# Patient Record
Sex: Female | Born: 1985
Health system: Southern US, Community
[De-identification: ages and names within clinical notes are randomized; demographics above are authoritative.]

## PROBLEM LIST (undated history)

## (undated) DIAGNOSIS — R011 Cardiac murmur, unspecified: Secondary | ICD-10-CM

## (undated) DIAGNOSIS — N390 Urinary tract infection, site not specified: Secondary | ICD-10-CM

## (undated) DIAGNOSIS — F32A Depression, unspecified: Secondary | ICD-10-CM

## (undated) DIAGNOSIS — K2970 Gastritis, unspecified, without bleeding: Secondary | ICD-10-CM

## (undated) DIAGNOSIS — F329 Major depressive disorder, single episode, unspecified: Secondary | ICD-10-CM

## (undated) DIAGNOSIS — F419 Anxiety disorder, unspecified: Secondary | ICD-10-CM

## (undated) DIAGNOSIS — K219 Gastro-esophageal reflux disease without esophagitis: Secondary | ICD-10-CM

## (undated) HISTORY — PX: CHOLECYSTECTOMY: SHX55

## (undated) HISTORY — PX: IUD REMOVAL: SHX5392

## (undated) HISTORY — DX: Cardiac murmur, unspecified: R01.1

---
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
## 2006-05-08 ENCOUNTER — Inpatient Hospital Stay: Payer: Self-pay

## 2007-06-05 ENCOUNTER — Ambulatory Visit: Payer: Self-pay | Admitting: Family Medicine

## 2007-06-28 ENCOUNTER — Ambulatory Visit: Payer: Self-pay | Admitting: General Surgery

## 2008-03-14 ENCOUNTER — Emergency Department (HOSPITAL_COMMUNITY): Admission: EM | Admit: 2008-03-14 | Discharge: 2008-03-14 | Payer: Self-pay | Admitting: Emergency Medicine

## 2008-08-07 HISTORY — PX: CHOLECYSTECTOMY: SHX55

## 2010-02-16 ENCOUNTER — Ambulatory Visit (HOSPITAL_COMMUNITY): Admission: RE | Admit: 2010-02-16 | Discharge: 2010-02-16 | Payer: Self-pay | Admitting: Obstetrics & Gynecology

## 2010-05-23 ENCOUNTER — Inpatient Hospital Stay (HOSPITAL_COMMUNITY): Admission: AD | Admit: 2010-05-23 | Discharge: 2010-05-23 | Payer: Self-pay | Admitting: Obstetrics & Gynecology

## 2010-05-23 ENCOUNTER — Ambulatory Visit: Payer: Self-pay | Admitting: Obstetrics and Gynecology

## 2010-07-29 ENCOUNTER — Ambulatory Visit (HOSPITAL_COMMUNITY)
Admission: RE | Admit: 2010-07-29 | Discharge: 2010-07-29 | Payer: Self-pay | Source: Home / Self Care | Attending: Family Medicine | Admitting: Family Medicine

## 2010-07-30 ENCOUNTER — Inpatient Hospital Stay (HOSPITAL_COMMUNITY)
Admission: AD | Admit: 2010-07-30 | Discharge: 2010-07-30 | Payer: Self-pay | Source: Home / Self Care | Attending: Obstetrics & Gynecology | Admitting: Obstetrics & Gynecology

## 2010-08-03 ENCOUNTER — Inpatient Hospital Stay (HOSPITAL_COMMUNITY)
Admission: AD | Admit: 2010-08-03 | Discharge: 2010-08-03 | Payer: Self-pay | Source: Home / Self Care | Attending: Obstetrics & Gynecology | Admitting: Obstetrics & Gynecology

## 2010-08-04 ENCOUNTER — Inpatient Hospital Stay (HOSPITAL_COMMUNITY)
Admission: AD | Admit: 2010-08-04 | Discharge: 2010-08-06 | Payer: Self-pay | Source: Home / Self Care | Attending: Family Medicine | Admitting: Family Medicine

## 2010-08-05 ENCOUNTER — Ambulatory Visit (HOSPITAL_COMMUNITY): Admission: RE | Admit: 2010-08-05 | Payer: Self-pay | Source: Home / Self Care | Admitting: Family Medicine

## 2010-10-17 LAB — CBC
HCT: 34.8 % — ABNORMAL LOW (ref 36.0–46.0)
HCT: 37.4 % (ref 36.0–46.0)
Hemoglobin: 11.6 g/dL — ABNORMAL LOW (ref 12.0–15.0)
MCH: 27.4 pg (ref 26.0–34.0)
MCV: 79.4 fL (ref 78.0–100.0)
MCV: 80.9 fL (ref 78.0–100.0)
Platelets: 140 10*3/uL — ABNORMAL LOW (ref 150–400)
RBC: 4.3 MIL/uL (ref 3.87–5.11)
RBC: 4.71 MIL/uL (ref 3.87–5.11)
WBC: 10.1 10*3/uL (ref 4.0–10.5)

## 2010-10-19 LAB — URINALYSIS, ROUTINE W REFLEX MICROSCOPIC
Nitrite: NEGATIVE
Protein, ur: NEGATIVE mg/dL

## 2011-01-23 ENCOUNTER — Inpatient Hospital Stay (INDEPENDENT_AMBULATORY_CARE_PROVIDER_SITE_OTHER)
Admission: RE | Admit: 2011-01-23 | Discharge: 2011-01-23 | Disposition: A | Discharge status: Home / Self Care | Payer: Self-pay | Source: Ambulatory Visit | Attending: Family Medicine | Admitting: Family Medicine

## 2011-01-23 DIAGNOSIS — G44209 Tension-type headache, unspecified, not intractable: Secondary | ICD-10-CM

## 2011-01-23 DIAGNOSIS — R198 Other specified symptoms and signs involving the digestive system and abdomen: Secondary | ICD-10-CM

## 2011-01-23 DIAGNOSIS — R1013 Epigastric pain: Secondary | ICD-10-CM

## 2011-05-28 ENCOUNTER — Emergency Department (HOSPITAL_COMMUNITY)
Admission: EM | Admit: 2011-05-28 | Discharge: 2011-05-28 | Disposition: A | Discharge status: Home / Self Care | Payer: Self-pay | Attending: Emergency Medicine | Admitting: Emergency Medicine

## 2011-05-28 ENCOUNTER — Emergency Department (HOSPITAL_COMMUNITY): Payer: Self-pay

## 2011-05-28 DIAGNOSIS — W19XXXA Unspecified fall, initial encounter: Secondary | ICD-10-CM | POA: Insufficient documentation

## 2011-05-28 DIAGNOSIS — M25439 Effusion, unspecified wrist: Secondary | ICD-10-CM | POA: Insufficient documentation

## 2011-05-28 DIAGNOSIS — S60219A Contusion of unspecified wrist, initial encounter: Secondary | ICD-10-CM | POA: Insufficient documentation

## 2012-11-15 ENCOUNTER — Emergency Department (HOSPITAL_COMMUNITY)
Admission: EM | Admit: 2012-11-15 | Discharge: 2012-11-15 | Disposition: A | Discharge status: Home / Self Care | Payer: Self-pay | Attending: Emergency Medicine | Admitting: Emergency Medicine

## 2012-11-15 ENCOUNTER — Encounter (HOSPITAL_COMMUNITY): Payer: Self-pay

## 2012-11-15 DIAGNOSIS — R300 Dysuria: Secondary | ICD-10-CM | POA: Insufficient documentation

## 2012-11-15 DIAGNOSIS — R5381 Other malaise: Secondary | ICD-10-CM | POA: Insufficient documentation

## 2012-11-15 DIAGNOSIS — Z79899 Other long term (current) drug therapy: Secondary | ICD-10-CM | POA: Insufficient documentation

## 2012-11-15 DIAGNOSIS — Z3202 Encounter for pregnancy test, result negative: Secondary | ICD-10-CM | POA: Insufficient documentation

## 2012-11-15 DIAGNOSIS — K219 Gastro-esophageal reflux disease without esophagitis: Secondary | ICD-10-CM | POA: Insufficient documentation

## 2012-11-15 DIAGNOSIS — R509 Fever, unspecified: Secondary | ICD-10-CM | POA: Insufficient documentation

## 2012-11-15 DIAGNOSIS — R51 Headache: Secondary | ICD-10-CM | POA: Insufficient documentation

## 2012-11-15 DIAGNOSIS — J020 Streptococcal pharyngitis: Secondary | ICD-10-CM | POA: Insufficient documentation

## 2012-11-15 DIAGNOSIS — IMO0001 Reserved for inherently not codable concepts without codable children: Secondary | ICD-10-CM | POA: Insufficient documentation

## 2012-11-15 DIAGNOSIS — Z87440 Personal history of urinary (tract) infections: Secondary | ICD-10-CM | POA: Insufficient documentation

## 2012-11-15 HISTORY — DX: Urinary tract infection, site not specified: N39.0

## 2012-11-15 HISTORY — DX: Gastro-esophageal reflux disease without esophagitis: K21.9

## 2012-11-15 LAB — URINALYSIS, ROUTINE W REFLEX MICROSCOPIC
Bilirubin Urine: NEGATIVE
Glucose, UA: NEGATIVE mg/dL
Hgb urine dipstick: NEGATIVE
Nitrite: NEGATIVE
Specific Gravity, Urine: 1.009 (ref 1.005–1.030)
pH: 6 (ref 5.0–8.0)

## 2012-11-15 LAB — POCT I-STAT, CHEM 8
Calcium, Ion: 1.18 mmol/L (ref 1.12–1.23)
Chloride: 103 mEq/L (ref 96–112)
Creatinine, Ser: 0.8 mg/dL (ref 0.50–1.10)
Glucose, Bld: 115 mg/dL — ABNORMAL HIGH (ref 70–99)
Potassium: 3.6 mEq/L (ref 3.5–5.1)
TCO2: 22 mmol/L (ref 0–100)

## 2012-11-15 LAB — RAPID STREP SCREEN (MED CTR MEBANE ONLY): Streptococcus, Group A Screen (Direct): POSITIVE — AB

## 2012-11-15 LAB — CBC
Hemoglobin: 12.4 g/dL (ref 12.0–15.0)
Platelets: 170 10*3/uL (ref 150–400)
RBC: 4.36 MIL/uL (ref 3.87–5.11)
WBC: 16.4 10*3/uL — ABNORMAL HIGH (ref 4.0–10.5)

## 2012-11-15 LAB — URINE MICROSCOPIC-ADD ON

## 2012-11-15 MED ORDER — ACETAMINOPHEN 325 MG PO TABS
650.0000 mg | ORAL_TABLET | Freq: Once | ORAL | Status: AC
  Administered 2012-11-15: 650 mg via ORAL
  Filled 2012-11-15: qty 2

## 2012-11-15 MED ORDER — PENICILLIN G BENZATHINE 1200000 UNIT/2ML IM SUSP
1.2000 10*6.[IU] | Freq: Once | INTRAMUSCULAR | Status: AC
  Administered 2012-11-15: 1.2 10*6.[IU] via INTRAMUSCULAR
  Filled 2012-11-15: qty 2

## 2012-11-15 MED ORDER — KETOROLAC TROMETHAMINE 30 MG/ML IJ SOLN
30.0000 mg | Freq: Once | INTRAMUSCULAR | Status: AC
  Administered 2012-11-15: 30 mg via INTRAVENOUS
  Filled 2012-11-15: qty 1

## 2012-11-15 MED ORDER — SODIUM CHLORIDE 0.9 % IV BOLUS (SEPSIS)
1000.0000 mL | Freq: Once | INTRAVENOUS | Status: AC
  Administered 2012-11-15: 1000 mL via INTRAVENOUS

## 2012-11-15 NOTE — ED Notes (Addendum)
Pt also c/o dizziness and upper back pain. Pt reports she was dx w/a UTI in March and did not complete her ABX of Cipro

## 2012-11-15 NOTE — ED Provider Notes (Signed)
History     CSN: [id number]  Arrival date & time 11/15/12  1121   First MD Initiated Contact with Patient 11/15/12 1216      Chief Complaint  Patient presents with  . Fever  . Headache    (Consider location/radiation/quality/duration/timing/severity/associated sxs/prior treatment) Patient is a 27 y.o. female presenting with fever and headaches.  Fever Associated symptoms: headaches   Headache Associated symptoms: fever    Pt with history of chronic headaches reports she has had a headache off and on for several weeks. Also began to run a fever last night with chills, myalgias, general malaise. This morning began to have sore throat as well. Denies any CP, SOB, no vomiting or diarrhea. She has had some dysuria. States she was diagnosed with UTI a couple of weeks ago at the [facility] and given Rx for Cipro but did not finish course.   Past Medical History  Diagnosis Date  . GERD (gastroesophageal reflux disease)   . UTI (lower urinary tract infection)     Past Surgical History  Procedure Laterality Date  . Cholecystectomy      History reviewed. No pertinent family history.  History  Substance Use Topics  . Smoking status: Never Smoker   . Smokeless tobacco: Not on file  . Alcohol Use: Yes     Comment: social    OB History   Grav Para Term Preterm Abortions TAB SAB Ect Mult Living                  Review of Systems  Constitutional: Positive for fever.  Neurological: Positive for headaches.   All other systems reviewed and are negative except as noted in HPI.   Allergies  Review of patient's allergies indicates no known allergies.  Home Medications   Current Outpatient Rx  Name  Route  Sig  Dispense  Refill  . butalbital-acetaminophen-caffeine (FIORICET, ESGIC) 50-325-40 MG per tablet   Oral   Take 1 tablet by mouth every 4 (four) hours as needed for headache.         . ciprofloxacin (CIPRO) 500 MG tablet   Oral   Take 500 mg by mouth 2 (two) times  daily. For 7 days.         . ranitidine (ZANTAC) 150 MG tablet   Oral   Take 150 mg by mouth daily.           BP 119/54  Pulse 111  Temp(Src) 103 F (39.4 C) (Oral)  Resp 18  SpO2 97%  Physical Exam  Nursing note and vitals reviewed. Constitutional: She is oriented to person, place, and time. She appears well-developed and well-nourished.  HENT:  Head: Normocephalic and atraumatic.  Eyes: EOM are normal. Pupils are equal, round, and reactive to light.  Neck: Normal range of motion. Neck supple.  Cardiovascular: Normal rate, normal heart sounds and intact distal pulses.   Pulmonary/Chest: Effort normal and breath sounds normal.  Abdominal: Bowel sounds are normal. She exhibits no distension. There is no tenderness. There is no rebound and no guarding.  Musculoskeletal: Normal range of motion. She exhibits no edema and no tenderness.  Neurological: She is alert and oriented to person, place, and time. She has normal strength. No cranial nerve deficit or sensory deficit.  Skin: Skin is warm and dry. No rash noted.  Psychiatric: She has a normal mood and affect.    ED Course  Procedures (including critical care time)  Labs Reviewed  RAPID STREP SCREEN - Abnormal;  Notable for the following:    Streptococcus, Group A Screen (Direct) POSITIVE (*)    All other components within normal limits  CBC - Abnormal; Notable for the following:    WBC 16.4 (*)    HCT 35.3 (*)    All other components within normal limits  URINALYSIS, ROUTINE W REFLEX MICROSCOPIC - Abnormal; Notable for the following:    APPearance CLOUDY (*)    Leukocytes, UA MODERATE (*)    All other components within normal limits  URINE MICROSCOPIC-ADD ON - Abnormal; Notable for the following:    Squamous Epithelial / LPF MANY (*)    Bacteria, UA FEW (*)    All other components within normal limits  POCT I-STAT, CHEM 8 - Abnormal; Notable for the following:    Glucose, Bld 115 (*)    All other components  within normal limits  POCT PREGNANCY, URINE   No results found.   1. Strep pharyngitis   2. Headache       MDM  Strep swab is positive, otherwise labs are normal. PCN IM, PCP referral.         Bonnita Levan. Bernette Mayers, MD 11/15/12 [phone]

## 2012-11-15 NOTE — ED Notes (Signed)
Pt c/o bilateral ear pain, bilateral eye pain, sore throat, fever, nausea and body aches starting last night and headache x1 week. Pt seen at [facility] and prescribed Fioricet 50/325 mg and Ibuprofen.

## 2013-04-17 ENCOUNTER — Encounter (HOSPITAL_COMMUNITY): Payer: Self-pay | Admitting: Emergency Medicine

## 2013-04-17 ENCOUNTER — Emergency Department (HOSPITAL_COMMUNITY)
Admission: EM | Admit: 2013-04-17 | Discharge: 2013-04-17 | Disposition: A | Discharge status: Home / Self Care | Payer: Self-pay | Attending: Emergency Medicine | Admitting: Emergency Medicine

## 2013-04-17 DIAGNOSIS — N949 Unspecified condition associated with female genital organs and menstrual cycle: Secondary | ICD-10-CM | POA: Insufficient documentation

## 2013-04-17 DIAGNOSIS — Z87440 Personal history of urinary (tract) infections: Secondary | ICD-10-CM | POA: Insufficient documentation

## 2013-04-17 DIAGNOSIS — Z3202 Encounter for pregnancy test, result negative: Secondary | ICD-10-CM | POA: Insufficient documentation

## 2013-04-17 DIAGNOSIS — Z79899 Other long term (current) drug therapy: Secondary | ICD-10-CM | POA: Insufficient documentation

## 2013-04-17 DIAGNOSIS — R102 Pelvic and perineal pain: Secondary | ICD-10-CM

## 2013-04-17 DIAGNOSIS — N939 Abnormal uterine and vaginal bleeding, unspecified: Secondary | ICD-10-CM

## 2013-04-17 DIAGNOSIS — N898 Other specified noninflammatory disorders of vagina: Secondary | ICD-10-CM | POA: Insufficient documentation

## 2013-04-17 DIAGNOSIS — R109 Unspecified abdominal pain: Secondary | ICD-10-CM | POA: Insufficient documentation

## 2013-04-17 DIAGNOSIS — Z9089 Acquired absence of other organs: Secondary | ICD-10-CM | POA: Insufficient documentation

## 2013-04-17 DIAGNOSIS — Z8719 Personal history of other diseases of the digestive system: Secondary | ICD-10-CM | POA: Insufficient documentation

## 2013-04-17 LAB — URINE MICROSCOPIC-ADD ON

## 2013-04-17 LAB — COMPREHENSIVE METABOLIC PANEL
ALT: 25 U/L (ref 0–35)
Albumin: 4.4 g/dL (ref 3.5–5.2)
Alkaline Phosphatase: 51 U/L (ref 39–117)
Chloride: 102 mEq/L (ref 96–112)
GFR calc Af Amer: >90 mL/min (ref >90)
Glucose, Bld: 121 mg/dL — ABNORMAL HIGH (ref 70–99)
Potassium: 3.4 mEq/L — ABNORMAL LOW (ref 3.5–5.1)
Sodium: 137 mEq/L (ref 135–145)
Total Protein: 7.9 g/dL (ref 6.0–8.3)

## 2013-04-17 LAB — URINALYSIS, ROUTINE W REFLEX MICROSCOPIC
Bilirubin Urine: NEGATIVE
Glucose, UA: NEGATIVE mg/dL
Ketones, ur: NEGATIVE mg/dL
pH: 5 (ref 5.0–8.0)

## 2013-04-17 LAB — CBC WITH DIFFERENTIAL/PLATELET
Eosinophils Absolute: 0.1 10*3/uL (ref 0.0–0.7)
Lymphs Abs: 2.4 10*3/uL (ref 0.7–4.0)
MCH: 28.6 pg (ref 26.0–34.0)
Neutro Abs: 5 10*3/uL (ref 1.7–7.7)
Neutrophils Relative %: 63 % (ref 43–77)
Platelets: 194 10*3/uL (ref 150–400)
RBC: 4.48 MIL/uL (ref 3.87–5.11)
WBC: 7.8 10*3/uL (ref 4.0–10.5)

## 2013-04-17 MED ORDER — MEDROXYPROGESTERONE ACETATE 5 MG PO TABS: 10.0000 mg | ORAL_TABLET | Freq: Every day | ORAL | Status: DC

## 2013-04-17 MED ORDER — MORPHINE SULFATE 4 MG/ML IJ SOLN
4.0000 mg | Freq: Once | INTRAMUSCULAR | Status: AC
  Administered 2013-04-17: 4 mg via INTRAMUSCULAR
  Filled 2013-04-17: qty 1

## 2013-04-17 MED ORDER — HYDROCODONE-ACETAMINOPHEN 5-325 MG PO TABS: 2.0000 | ORAL_TABLET | ORAL | Status: DC | PRN

## 2013-04-17 NOTE — ED Notes (Signed)
Patient is resting comfortably. 

## 2013-04-17 NOTE — ED Notes (Signed)
Pt c/o vaginal bleeding and cramping; pt sts positive pregnancy test 2 weeks ago; pt unsure of LMP; G5 P3 M1

## 2013-04-25 NOTE — ED Provider Notes (Signed)
CSN: [id number]     Arrival date & time 04/17/13  1623 History   First MD Initiated Contact with Patient 04/17/13 1704     Chief Complaint  Patient presents with  . Vaginal Bleeding  . Abdominal Pain   (Consider location/radiation/quality/duration/timing/severity/associated sxs/prior Treatment) HPI Comments: Patient presents to the ER for evaluation of abdominal and pelvic pain and cramping with vaginal bleeding. Patient reports that she had a positive pregnancy test 2 weeks ago. She is not sure when her last menstrual period was. Pain cramping or moderate to severe at times. Bleeding is similar to or slightly heavier than previous periods.  Patient is a 27 y.o. female presenting with vaginal bleeding and abdominal pain.  Vaginal Bleeding Associated symptoms: abdominal pain   Abdominal Pain Associated symptoms: vaginal bleeding     Past Medical History  Diagnosis Date  . GERD (gastroesophageal reflux disease)   . UTI (lower urinary tract infection)    Past Surgical History  Procedure Laterality Date  . Cholecystectomy     History reviewed. No pertinent family history. History  Substance Use Topics  . Smoking status: Never Smoker   . Smokeless tobacco: Not on file  . Alcohol Use: Yes     Comment: social   OB History   Grav Para Term Preterm Abortions TAB SAB Ect Mult Living                 Review of Systems  Gastrointestinal: Positive for abdominal pain.  Genitourinary: Positive for vaginal bleeding and pelvic pain.  All other systems reviewed and are negative.    Allergies  Review of patient's allergies indicates no known allergies.  Home Medications   Current Outpatient Rx  Name  Route  Sig  Dispense  Refill  . acetaminophen (TYLENOL) 500 MG tablet   Oral   Take 1,000 mg by mouth every 6 (six) hours as needed for pain.         Marland Kitchen HYDROcodone-acetaminophen (NORCO/VICODIN) 5-325 MG per tablet   Oral   Take 2 tablets by mouth every 4 (four) hours as needed  for pain.   10 tablet   0   . medroxyPROGESTERone (PROVERA) 5 MG tablet   Oral   Take 2 tablets (10 mg total) by mouth daily.   14 tablet   0    BP 110/78  Pulse 58  Temp(Src) 98 F (36.7 C) (Oral)  Resp 18  SpO2 100% Physical Exam  Constitutional: She is oriented to person, place, and time. She appears well-developed and well-nourished. No distress.  HENT:  Head: Normocephalic and atraumatic.  Right Ear: Hearing normal.  Left Ear: Hearing normal.  Nose: Nose normal.  Mouth/Throat: Oropharynx is clear and moist and mucous membranes are normal.  Eyes: Conjunctivae and EOM are normal. Pupils are equal, round, and reactive to light.  Neck: Normal range of motion. Neck supple.  Cardiovascular: Regular rhythm, S1 normal and S2 normal.  Exam reveals no gallop and no friction rub.   No murmur heard. Pulmonary/Chest: Effort normal and breath sounds normal. No respiratory distress. She exhibits no tenderness.  Abdominal: Soft. Normal appearance and bowel sounds are normal. There is no hepatosplenomegaly. There is no tenderness. There is no rebound, no guarding, no tenderness at McBurney's point and negative Murphy's sign. No hernia.  Genitourinary: There is bleeding around the vagina.  Musculoskeletal: Normal range of motion.  Neurological: She is alert and oriented to person, place, and time. She has normal strength. No cranial nerve deficit or  sensory deficit. Coordination normal. GCS eye subscore is 4. GCS verbal subscore is 5. GCS motor subscore is 6.  Skin: Skin is warm, dry and intact. No rash noted. No cyanosis.  Psychiatric: She has a normal mood and affect. Her speech is normal and behavior is normal. Thought content normal.    ED Course  Procedures (including critical care time) Labs Review Labs Reviewed  WET PREP, GENITAL - Abnormal; Notable for the following:    WBC, Wet Prep HPF POC FEW (*)    All other components within normal limits  COMPREHENSIVE METABOLIC PANEL -  Abnormal; Notable for the following:    Potassium 3.4 (*)    Glucose, Bld 121 (*)    Total Bilirubin 1.4 (*)    All other components within normal limits  URINALYSIS, ROUTINE W REFLEX MICROSCOPIC - Abnormal; Notable for the following:    Color, Urine AMBER (*)    APPearance CLOUDY (*)    Hgb urine dipstick LARGE (*)    Protein, ur 30 (*)    Leukocytes, UA SMALL (*)    All other components within normal limits  URINE MICROSCOPIC-ADD ON - Abnormal; Notable for the following:    Bacteria, UA FEW (*)    All other components within normal limits  GC/CHLAMYDIA PROBE AMP  CBC WITH DIFFERENTIAL  HCG, QUANTITATIVE, PREGNANCY  POCT PREGNANCY, URINE   Imaging Review No results found.  MDM   1. Vaginal bleeding   2. Pelvic pain    She presented with complaints of abdominal pain, pelvic pain, vaginal bleeding. She was concerned that she was pregnant, but blood and urine tests were negative. Remainder of the workup was unremarkable. Patient treated for painful vaginal bleeding, followup with OB/GYN.    Gilda Crease, MD 04/25/13 [phone]

## 2014-03-16 ENCOUNTER — Other Ambulatory Visit (HOSPITAL_COMMUNITY): Payer: Self-pay | Admitting: Physician Assistant

## 2014-03-16 DIAGNOSIS — Z3689 Encounter for other specified antenatal screening: Secondary | ICD-10-CM

## 2014-03-16 LAB — OB RESULTS CONSOLE GC/CHLAMYDIA
Chlamydia: NEGATIVE
Gonorrhea: NEGATIVE

## 2014-03-16 LAB — OB RESULTS CONSOLE RUBELLA ANTIBODY, IGM: Rubella: IMMUNE

## 2014-03-16 LAB — OB RESULTS CONSOLE RPR: RPR: NONREACTIVE

## 2014-03-16 LAB — OB RESULTS CONSOLE ANTIBODY SCREEN: ANTIBODY SCREEN: NEGATIVE

## 2014-03-16 LAB — OB RESULTS CONSOLE ABO/RH: RH Type: POSITIVE

## 2014-03-16 LAB — OB RESULTS CONSOLE HEPATITIS B SURFACE ANTIGEN: Hepatitis B Surface Ag: NEGATIVE

## 2014-03-16 LAB — OB RESULTS CONSOLE HIV ANTIBODY (ROUTINE TESTING): HIV: NONREACTIVE

## 2014-04-08 ENCOUNTER — Ambulatory Visit (HOSPITAL_COMMUNITY)
Admission: RE | Admit: 2014-04-08 | Discharge: 2014-04-08 | Disposition: A | Discharge status: Home / Self Care | Payer: Self-pay | Source: Ambulatory Visit | Attending: Physician Assistant | Admitting: Physician Assistant

## 2014-04-08 DIAGNOSIS — Z1389 Encounter for screening for other disorder: Secondary | ICD-10-CM | POA: Insufficient documentation

## 2014-04-08 DIAGNOSIS — Z363 Encounter for antenatal screening for malformations: Secondary | ICD-10-CM | POA: Insufficient documentation

## 2014-04-08 DIAGNOSIS — Z3689 Encounter for other specified antenatal screening: Secondary | ICD-10-CM | POA: Insufficient documentation

## 2014-08-07 NOTE — L&D Delivery Note (Signed)
Delivery Note At 1:58 AM a viable female was delivered via Vaginal, Spontaneous Delivery (Presentation: ROA  ).  APGAR: 8,9 ; weight pending .   Placenta status: spontaneous ,intact .  Cord:3v  with the following complications:none .  Cord pH:n/a  Anesthesia: Epidural  Episiotomy: None Lacerations:  1st degree labial which did not require repair Suture Repair: na Est. Blood Loss (mL):    Mom to postpartum.  Baby to Couplet care / Skin to Skin.  Rolm BookbinderMoss, Ma Munoz 09/07/2014, 2:13 AM

## 2014-08-12 LAB — OB RESULTS CONSOLE GBS: GBS: NEGATIVE

## 2014-08-12 LAB — OB RESULTS CONSOLE GC/CHLAMYDIA
CHLAMYDIA, DNA PROBE: NEGATIVE
Gonorrhea: NEGATIVE

## 2014-08-20 ENCOUNTER — Other Ambulatory Visit (HOSPITAL_COMMUNITY): Payer: Self-pay | Admitting: Nurse Practitioner

## 2014-08-20 DIAGNOSIS — O322XX0 Maternal care for transverse and oblique lie, not applicable or unspecified: Secondary | ICD-10-CM

## 2014-08-25 ENCOUNTER — Ambulatory Visit (HOSPITAL_COMMUNITY)
Admission: RE | Admit: 2014-08-25 | Discharge: 2014-08-25 | Disposition: A | Discharge status: Home / Self Care | Payer: Self-pay | Source: Ambulatory Visit | Attending: Nurse Practitioner | Admitting: Nurse Practitioner

## 2014-08-25 DIAGNOSIS — Z36 Encounter for antenatal screening of mother: Secondary | ICD-10-CM | POA: Insufficient documentation

## 2014-08-25 DIAGNOSIS — Z3689 Encounter for other specified antenatal screening: Secondary | ICD-10-CM | POA: Insufficient documentation

## 2014-08-25 DIAGNOSIS — Z3A38 38 weeks gestation of pregnancy: Secondary | ICD-10-CM | POA: Insufficient documentation

## 2014-08-25 DIAGNOSIS — O322XX0 Maternal care for transverse and oblique lie, not applicable or unspecified: Secondary | ICD-10-CM

## 2014-09-04 ENCOUNTER — Other Ambulatory Visit (HOSPITAL_COMMUNITY): Payer: Self-pay | Admitting: Urology

## 2014-09-04 DIAGNOSIS — O480 Post-term pregnancy: Secondary | ICD-10-CM

## 2014-09-06 ENCOUNTER — Encounter (HOSPITAL_COMMUNITY): Payer: Self-pay | Admitting: *Deleted

## 2014-09-06 ENCOUNTER — Inpatient Hospital Stay (HOSPITAL_COMMUNITY): Payer: Medicaid Other | Admitting: Anesthesiology

## 2014-09-06 ENCOUNTER — Inpatient Hospital Stay (HOSPITAL_COMMUNITY)
Admission: AD | Admit: 2014-09-06 | Discharge: 2014-09-09 | DRG: 775 | Disposition: A | Discharge status: Home / Self Care | Payer: Medicaid Other | Source: Ambulatory Visit | Attending: Obstetrics & Gynecology | Admitting: Obstetrics & Gynecology

## 2014-09-06 DIAGNOSIS — Z3A40 40 weeks gestation of pregnancy: Secondary | ICD-10-CM | POA: Diagnosis present

## 2014-09-06 HISTORY — DX: Anxiety disorder, unspecified: F41.9

## 2014-09-06 HISTORY — DX: Depression, unspecified: F32.A

## 2014-09-06 HISTORY — DX: Gastritis, unspecified, without bleeding: K29.70

## 2014-09-06 HISTORY — DX: Major depressive disorder, single episode, unspecified: F32.9

## 2014-09-06 LAB — CBC
HEMATOCRIT: 34.5 % — AB (ref 36.0–46.0)
HEMATOCRIT: 34 % — AB (ref 36.0–46.0)
HEMOGLOBIN: 11.2 g/dL — AB (ref 12.0–15.0)
Hemoglobin: 11.6 g/dL — ABNORMAL LOW (ref 12.0–15.0)
MCH: 27.4 pg (ref 26.0–34.0)
MCH: 27.1 pg (ref 26.0–34.0)
MCHC: 33.6 g/dL (ref 30.0–36.0)
MCHC: 32.9 g/dL (ref 30.0–36.0)
MCV: 81.6 fL (ref 78.0–100.0)
MCV: 82.3 fL (ref 78.0–100.0)
PLATELETS: 133 10*3/uL — AB (ref 150–400)
Platelets: 126 10*3/uL — ABNORMAL LOW (ref 150–400)
RBC: 4.23 MIL/uL (ref 3.87–5.11)
RBC: 4.13 MIL/uL (ref 3.87–5.11)
RDW: 14.5 % (ref 11.5–15.5)
RDW: 14.6 % (ref 11.5–15.5)
WBC: 8.4 10*3/uL (ref 4.0–10.5)
WBC: 7 10*3/uL (ref 4.0–10.5)

## 2014-09-06 LAB — URINE MICROSCOPIC-ADD ON

## 2014-09-06 LAB — RAPID URINE DRUG SCREEN, HOSP PERFORMED
Amphetamines: NOT DETECTED
BARBITURATES: NOT DETECTED
Benzodiazepines: NOT DETECTED
COCAINE: NOT DETECTED
OPIATES: NOT DETECTED
Tetrahydrocannabinol: NOT DETECTED

## 2014-09-06 LAB — URINALYSIS, ROUTINE W REFLEX MICROSCOPIC
Bilirubin Urine: NEGATIVE
Glucose, UA: NEGATIVE mg/dL
HGB URINE DIPSTICK: NEGATIVE
Ketones, ur: NEGATIVE mg/dL
Leukocytes, UA: NEGATIVE
Nitrite: NEGATIVE
Protein, ur: 30 mg/dL — AB
Specific Gravity, Urine: 1.025 (ref 1.005–1.030)
Urobilinogen, UA: 0.2 mg/dL (ref 0.0–1.0)
pH: 6 (ref 5.0–8.0)

## 2014-09-06 LAB — TYPE AND SCREEN
ABO/RH(D): A POS
ANTIBODY SCREEN: NEGATIVE

## 2014-09-06 LAB — AMNISURE RUPTURE OF MEMBRANE (ROM) NOT AT ARMC: Amnisure ROM: POSITIVE

## 2014-09-06 LAB — ABO/RH: ABO/RH(D): A POS

## 2014-09-06 MED ORDER — LIDOCAINE HCL (PF) 1 % IJ SOLN
30.0000 mL | INTRAMUSCULAR | Status: DC | PRN
  Filled 2014-09-06: qty 30

## 2014-09-06 MED ORDER — ACETAMINOPHEN 325 MG PO TABS: 650.0000 mg | ORAL_TABLET | ORAL | Status: DC | PRN

## 2014-09-06 MED ORDER — OXYTOCIN BOLUS FROM INFUSION
500.0000 mL | INTRAVENOUS | Status: DC
  Administered 2014-09-07: 500 mL via INTRAVENOUS

## 2014-09-06 MED ORDER — EPHEDRINE 5 MG/ML INJ
10.0000 mg | INTRAVENOUS | Status: DC | PRN
  Filled 2014-09-06: qty 2

## 2014-09-06 MED ORDER — OXYCODONE-ACETAMINOPHEN 5-325 MG PO TABS: 2.0000 | ORAL_TABLET | ORAL | Status: DC | PRN

## 2014-09-06 MED ORDER — FENTANYL CITRATE 0.05 MG/ML IJ SOLN
100.0000 ug | INTRAMUSCULAR | Status: DC | PRN
Start: 2014-09-06 — End: 2014-09-07
  Administered 2014-09-06 (×2): 100 ug via INTRAVENOUS
  Filled 2014-09-06 (×2): qty 2

## 2014-09-06 MED ORDER — FENTANYL 2.5 MCG/ML BUPIVACAINE 1/10 % EPIDURAL INFUSION (WH - ANES)
14.0000 mL/h | INTRAMUSCULAR | Status: DC | PRN
  Administered 2014-09-06: 14 mL/h via EPIDURAL

## 2014-09-06 MED ORDER — LACTATED RINGERS IV SOLN
500.0000 mL | Freq: Once | INTRAVENOUS | Status: AC
  Administered 2014-09-06: 500 mL via INTRAVENOUS

## 2014-09-06 MED ORDER — LACTATED RINGERS IV SOLN
INTRAVENOUS | Status: DC
  Administered 2014-09-06 – 2014-09-07 (×3): via INTRAVENOUS

## 2014-09-06 MED ORDER — LACTATED RINGERS IV SOLN: 500.0000 mL | INTRAVENOUS | Status: DC | PRN

## 2014-09-06 MED ORDER — PHENYLEPHRINE 40 MCG/ML (10ML) SYRINGE FOR IV PUSH (FOR BLOOD PRESSURE SUPPORT)
PREFILLED_SYRINGE | INTRAVENOUS | Status: DC
Start: 2014-09-06 — End: 2014-09-07
  Filled 2014-09-06: qty 20

## 2014-09-06 MED ORDER — OXYTOCIN 40 UNITS IN LACTATED RINGERS INFUSION - SIMPLE MED
62.5000 mL/h | INTRAVENOUS | Status: DC
  Administered 2014-09-07: 62.5 mL/h via INTRAVENOUS

## 2014-09-06 MED ORDER — OXYCODONE-ACETAMINOPHEN 5-325 MG PO TABS: 1.0000 | ORAL_TABLET | ORAL | Status: DC | PRN

## 2014-09-06 MED ORDER — FENTANYL 2.5 MCG/ML BUPIVACAINE 1/10 % EPIDURAL INFUSION (WH - ANES)
INTRAMUSCULAR | Status: DC | PRN
  Administered 2014-09-06: 14 mL/h via EPIDURAL

## 2014-09-06 MED ORDER — FENTANYL 2.5 MCG/ML BUPIVACAINE 1/10 % EPIDURAL INFUSION (WH - ANES)
INTRAMUSCULAR | Status: AC
  Filled 2014-09-06: qty 125

## 2014-09-06 MED ORDER — PHENYLEPHRINE 40 MCG/ML (10ML) SYRINGE FOR IV PUSH (FOR BLOOD PRESSURE SUPPORT)
80.0000 ug | PREFILLED_SYRINGE | INTRAVENOUS | Status: DC | PRN
  Filled 2014-09-06: qty 2

## 2014-09-06 MED ORDER — TERBUTALINE SULFATE 1 MG/ML IJ SOLN: 0.2500 mg | Freq: Once | INTRAMUSCULAR | Status: AC | PRN

## 2014-09-06 MED ORDER — ONDANSETRON HCL 4 MG/2ML IJ SOLN: 4.0000 mg | Freq: Four times a day (QID) | INTRAMUSCULAR | Status: DC | PRN

## 2014-09-06 MED ORDER — OXYTOCIN 40 UNITS IN LACTATED RINGERS INFUSION - SIMPLE MED
1.0000 m[IU]/min | INTRAVENOUS | Status: DC
  Administered 2014-09-06: 2 m[IU]/min via INTRAVENOUS
  Filled 2014-09-06: qty 1000

## 2014-09-06 MED ORDER — LIDOCAINE HCL (PF) 1 % IJ SOLN
INTRAMUSCULAR | Status: DC | PRN
  Administered 2014-09-06 (×2): 4 mL

## 2014-09-06 MED ORDER — DIPHENHYDRAMINE HCL 50 MG/ML IJ SOLN: 12.5000 mg | INTRAMUSCULAR | Status: DC | PRN

## 2014-09-06 MED ORDER — CITRIC ACID-SODIUM CITRATE 334-500 MG/5ML PO SOLN: 30.0000 mL | ORAL | Status: DC | PRN

## 2014-09-06 NOTE — Anesthesia Preprocedure Evaluation (Addendum)
Anesthesia Evaluation  Patient identified by MRN, date of birth, ID band Patient awake    Reviewed: Allergy & Precautions, NPO status , Patient's Chart, lab work & pertinent test results  History of Anesthesia Complications Negative for: history of anesthetic complications  Airway Mallampati: III  TM Distance: >3 FB Neck ROM: Full    Dental no notable dental hx. (+) Dental Advisory Given   Pulmonary neg pulmonary ROS,  breath sounds clear to auscultation  Pulmonary exam normal       Cardiovascular Exercise Tolerance: Good negative cardio ROS  Rhythm:Regular Rate:Normal     Neuro/Psych PSYCHIATRIC DISORDERS Anxiety Depression negative neurological ROS     GI/Hepatic negative GI ROS, Neg liver ROS,   Endo/Other  obesity  Renal/GU negative Renal ROS  negative genitourinary   Musculoskeletal negative musculoskeletal ROS (+)   Abdominal   Peds negative pediatric ROS (+)  Hematology negative hematology ROS (+)   Anesthesia Other Findings   Reproductive/Obstetrics (+) Pregnancy                            Anesthesia Physical Anesthesia Plan  ASA: II  Anesthesia Plan: Epidural   Post-op Pain Management:    Induction:   Airway Management Planned:   Additional Equipment:   Intra-op Plan:   Post-operative Plan:   Informed Consent: I have reviewed the patients History and Physical, chart, labs and discussed the procedure including the risks, benefits and alternatives for the proposed anesthesia with the patient or authorized representative who has indicated his/her understanding and acceptance.   Dental advisory given  Plan Discussed with:   Anesthesia Plan Comments:         Anesthesia Quick Evaluation

## 2014-09-06 NOTE — MAU Note (Signed)
Per HMitchell, RN charge, pt to go to room 164 

## 2014-09-06 NOTE — Progress Notes (Signed)
[facility] Melanie ApleyCastro is a 29 y.o. [phone] [id number] at [id number]  admitted for induction of labor due to PROM  09/05/14 @0730 .  Subjective: Pt is now feeling more uncomfortable with contractions. She denies pressure with contraction. No other complaints.  Objective: BP 112/69 mmHg  Pulse 77  Temp(Src) 98.5 F (36.9 C) (Oral)  Resp 18  Ht 5' 1.25" (1.556 m)  Wt 86.807 kg (191 lb 6 oz)  BMI 35.85 kg/m2  LMP 11/29/2013      FHT:  FHR: 150 bpm, variability: moderate,  accelerations:  Present,  decelerations:  Present single, early UC:   irregular, every 2-4 minutes SVE:   Dilation: 3 Effacement (%): Thick Station: -3 Exam by:: Ace GinsL. Cresenzo, RN  Labs: Lab Results  Component Value Date   WBC 8.4 09/06/2014   HGB 11.6* 09/06/2014   HCT 34.5* 09/06/2014   MCV 81.6 09/06/2014   PLT 133* 09/06/2014    Assessment / Plan: Induction of labor due to PROM,  progressing well on pitocin  Labor: on pitocin, little cervical change at this check. Per nursing, station is improved since last cervical check.  Preeclampsia:  no signs or symptoms of toxicity Fetal Wellbeing:  Category I Pain Control:  Fentanyl and planning epidural  I/D:  GBS neg Anticipated MOD:  NSVD  Melanie Newman, Mekaela Azizi 09/06/2014, 8:46 PM

## 2014-09-06 NOTE — Progress Notes (Signed)
Assisted RN with interpretation of patient questions.  Spanish Interpreter

## 2014-09-06 NOTE — Progress Notes (Signed)
Assisted RN with interpretation of patient questions.  °Spanish Interpreter  °

## 2014-09-06 NOTE — H&P (Signed)
Melanie Newman is a 29 y.o. female presenting for SROM sine 0730 yesterday morning. Maternal Medical History:  Reason for admission: Rupture of membranes.   Contractions: Onset was 1-2 hours ago.   Frequency: irregular.   Perceived severity is mild.    Fetal activity: Perceived fetal activity is normal.   Last perceived fetal movement was within the past hour.      OB History    Gravida Para Term Preterm AB TAB SAB Ectopic Multiple Living   5 3 3  1  1   3      Past Medical History  Diagnosis Date  . Depression     mild pp depression after 1st pregnancy  . Gastritis   . Anxiety    Past Surgical History  Procedure Laterality Date  . Cholecystectomy  2010   Family History: family history is not on file. Social History:  has no tobacco, alcohol, and drug history on file.   Prenatal Transfer Tool  Maternal Diabetes: No Genetic Screening: Normal Maternal Ultrasounds/Referrals: Declined Fetal Ultrasounds or other Referrals:  None Maternal Substance Abuse:  No Significant Maternal Medications:  None Significant Maternal Lab Results:  None Other Comments:  None  Review of Systems  Constitutional: Negative.   HENT: Negative.   Eyes: Negative.   Respiratory: Negative.   Cardiovascular: Negative.   Gastrointestinal: Positive for abdominal pain.  Genitourinary: Negative.   Musculoskeletal: Negative.   Skin: Negative.   Neurological: Negative.   Endo/Heme/Allergies: Negative.   Psychiatric/Behavioral: Negative.       Blood pressure 106/71, pulse 85, temperature 98.3 F (36.8 C), temperature source Oral, resp. rate 18, height 5' 1.25" (1.556 m), weight 191 lb 6 oz (86.807 kg), last menstrual period 11/29/2013. Maternal Exam:  Uterine Assessment: Contraction strength is mild.  Contraction frequency is irregular.   Abdomen: Patient reports no abdominal tenderness. Fetal presentation: vertex  Introitus: Normal vulva. Normal vagina.    Fetal Exam Fetal  Monitor Review: Mode: ultrasound.   Variability: moderate (6-25 bpm).   Pattern: accelerations present.    Fetal State Assessment: Category I - tracings are normal.     Physical Exam  Constitutional: She is oriented to person, place, and time. She appears well-developed and well-nourished.  HENT:  Head: Normocephalic.  Neck: Normal range of motion.  Cardiovascular: Normal rate, regular rhythm, normal heart sounds and intact distal pulses.   Respiratory: Effort normal and breath sounds normal.  GI: Soft.  Genitourinary: Vagina normal and uterus normal.  Musculoskeletal: Normal range of motion.  Neurological: She is alert and oriented to person, place, and time. She has normal reflexes.  Skin: Skin is warm and dry.  Psychiatric: She has a normal mood and affect. Her behavior is normal. Judgment and thought content normal.    Prenatal labs: ABO, Rh: A/Positive/-- (08/10 0000) Antibody: Negative (08/10 0000) Rubella: Immune (08/10 0000) RPR: Nonreactive (08/10 0000)  HBsAg: Negative (08/10 0000)  HIV: Non-reactive (08/10 0000)  GBS: Negative (01/06 0000)   Assessment/Plan: PPROM admit   LAWSON, MARIE DARLENE 09/06/2014, 2:34 PM

## 2014-09-06 NOTE — Progress Notes (Addendum)
I assisted RN with interpretation of admission  Of patient.  Also ordered patient meals. Spanish Interpreter

## 2014-09-06 NOTE — Progress Notes (Signed)
Spoke with darlene, cnm. Ok for patient to go ahead and get epidural.

## 2014-09-06 NOTE — Progress Notes (Signed)
Assisted RN with interpretation of patient assessment.   °Spanish Interpreter °

## 2014-09-06 NOTE — Progress Notes (Signed)
Notified of pt in MAU with c/o possible ROM since 0730 yesterday am. FHT tachy, orders obtained.

## 2014-09-06 NOTE — Anesthesia Procedure Notes (Signed)
Epidural Patient location during procedure: OB Start time: 09/06/2014 10:55 PM  Staffing Anesthesiologist: Karie SchwalbeJUDD, Lamya Lausch JENNETTE Performed by: anesthesiologist   Preanesthetic Checklist Completed: patient identified, site marked, surgical consent, pre-op evaluation, timeout performed, IV checked, risks and benefits discussed and monitors and equipment checked  Epidural Patient position: sitting Prep: site prepped and draped and DuraPrep Patient monitoring: continuous pulse ox and blood pressure Approach: midline Location: L3-L4 Injection technique: LOR saline  Needle:  Needle type: Tuohy  Needle gauge: 17 G Needle length: 9 cm and 9 Needle insertion depth: 6 cm Catheter type: closed end flexible Catheter size: 19 Gauge Catheter at skin depth: 10 cm Test dose: negative  Assessment Events: blood not aspirated, injection not painful, no injection resistance, negative IV test and no paresthesia  Additional Notes Patient identified. Risks/Benefits/Options discussed with patient including but not limited to bleeding, infection, nerve damage, paralysis, failed block, incomplete pain control, headache, blood pressure changes, nausea, vomiting, reactions to medication both or allergic, itching and postpartum back pain. Confirmed with bedside nurse the patient's most recent platelet count. Confirmed with patient that they are not currently taking any anticoagulation, have any bleeding history or any family history of bleeding disorders. Patient expressed understanding and wished to proceed. All questions were answered. Sterile technique was used throughout the entire procedure. Please see nursing notes for vital signs. Test dose was given through epidural catheter and negative prior to continuing to dose epidural or start infusion. Warning signs of high block given to the patient including shortness of breath, tingling/numbness in hands, complete motor block, or any concerning symptoms with  instructions to call for help. Patient was given instructions on fall risk and not to get out of bed. All questions and concerns addressed with instructions to call with any issues or inadequate analgesia.

## 2014-09-06 NOTE — Progress Notes (Signed)
Assisted RN with interpretation of questions to patient progress Spanish Interpreter

## 2014-09-07 ENCOUNTER — Encounter (HOSPITAL_COMMUNITY): Payer: Self-pay | Admitting: *Deleted

## 2014-09-07 DIAGNOSIS — Z3A40 40 weeks gestation of pregnancy: Secondary | ICD-10-CM

## 2014-09-07 LAB — RPR: RPR Ser Ql: NONREACTIVE

## 2014-09-07 MED ORDER — DIPHENHYDRAMINE HCL 25 MG PO CAPS: 25.0000 mg | ORAL_CAPSULE | Freq: Four times a day (QID) | ORAL | Status: DC | PRN

## 2014-09-07 MED ORDER — OXYCODONE-ACETAMINOPHEN 5-325 MG PO TABS: 2.0000 | ORAL_TABLET | ORAL | Status: DC | PRN

## 2014-09-07 MED ORDER — ONDANSETRON HCL 4 MG PO TABS: 4.0000 mg | ORAL_TABLET | ORAL | Status: DC | PRN

## 2014-09-07 MED ORDER — TETANUS-DIPHTH-ACELL PERTUSSIS 5-2.5-18.5 LF-MCG/0.5 IM SUSP: 0.5000 mL | Freq: Once | INTRAMUSCULAR | Status: DC

## 2014-09-07 MED ORDER — SENNOSIDES-DOCUSATE SODIUM 8.6-50 MG PO TABS
2.0000 | ORAL_TABLET | ORAL | Status: DC
  Administered 2014-09-08 (×2): 2 via ORAL
  Filled 2014-09-07 (×2): qty 2

## 2014-09-07 MED ORDER — BENZOCAINE-MENTHOL 20-0.5 % EX AERO
1.0000 "application " | INHALATION_SPRAY | CUTANEOUS | Status: DC | PRN
  Administered 2014-09-07: 1 via TOPICAL
  Filled 2014-09-07 (×2): qty 56

## 2014-09-07 MED ORDER — WITCH HAZEL-GLYCERIN EX PADS: 1.0000 "application " | MEDICATED_PAD | CUTANEOUS | Status: DC | PRN

## 2014-09-07 MED ORDER — ONDANSETRON HCL 4 MG/2ML IJ SOLN: 4.0000 mg | INTRAMUSCULAR | Status: DC | PRN

## 2014-09-07 MED ORDER — SIMETHICONE 80 MG PO CHEW: 80.0000 mg | CHEWABLE_TABLET | ORAL | Status: DC | PRN

## 2014-09-07 MED ORDER — IBUPROFEN 600 MG PO TABS
600.0000 mg | ORAL_TABLET | Freq: Four times a day (QID) | ORAL | Status: DC
  Administered 2014-09-07 – 2014-09-09 (×10): 600 mg via ORAL
  Filled 2014-09-07 (×10): qty 1

## 2014-09-07 MED ORDER — DIBUCAINE 1 % RE OINT: 1.0000 "application " | TOPICAL_OINTMENT | RECTAL | Status: DC | PRN

## 2014-09-07 MED ORDER — PRENATAL MULTIVITAMIN CH
1.0000 | ORAL_TABLET | Freq: Every day | ORAL | Status: DC
  Administered 2014-09-07 – 2014-09-09 (×3): 1 via ORAL
  Filled 2014-09-07 (×3): qty 1

## 2014-09-07 MED ORDER — LANOLIN HYDROUS EX OINT: TOPICAL_OINTMENT | CUTANEOUS | Status: DC | PRN

## 2014-09-07 MED ORDER — OXYCODONE-ACETAMINOPHEN 5-325 MG PO TABS
1.0000 | ORAL_TABLET | ORAL | Status: DC | PRN
  Administered 2014-09-07 – 2014-09-08 (×3): 1 via ORAL
  Filled 2014-09-07 (×3): qty 1

## 2014-09-07 MED ORDER — ZOLPIDEM TARTRATE 5 MG PO TABS: 5.0000 mg | ORAL_TABLET | Freq: Every evening | ORAL | Status: DC | PRN

## 2014-09-07 NOTE — Consult Note (Signed)
Spanish interpreter called to come interpret education and orientation for patient now.

## 2014-09-07 NOTE — Progress Notes (Signed)
Assisted RN with interpretation of patient assessment.   °Spanish Interpreter °

## 2014-09-07 NOTE — Anesthesia Postprocedure Evaluation (Signed)
  Anesthesia Post-op Note Spanish interpreter, Eda, at bedside during interview.  Patient: Melanie Newman  Procedure(s) Performed: * No procedures listed *  Patient Location: Mother/Baby  Anesthesia Type:Epidural  Level of Consciousness: awake, alert , oriented and patient cooperative  Airway and Oxygen Therapy: Patient Spontanous Breathing  Post-op Pain: mild  Post-op Assessment: Post-op Vital signs reviewed, Patient's Cardiovascular Status Stable, Respiratory Function Stable, Patent Airway, No headache, No backache, No residual numbness and No residual motor weakness  Post-op Vital Signs: Reviewed and stable  Last Vitals:  Filed Vitals:   09/07/14 0643  BP: 89/48  Pulse: 65  Temp: 37.3 C  Resp: 18    Complications: No apparent anesthesia complications

## 2014-09-07 NOTE — Progress Notes (Signed)
Ur chart review completed.  

## 2014-09-07 NOTE — Progress Notes (Signed)
I stopped by to check on patient and assisted Grace, NT with baby appointment. Eda H Royal  Interpreter. °

## 2014-09-07 NOTE — Progress Notes (Signed)
I ordered patient's lunch.  Melanie Newman  Interpreter. °

## 2014-09-07 NOTE — Lactation Note (Signed)
This note was copied from the chart of Girl Deniece PortelaMaria Argueta Castro. Lactation Consultation Note  Patient Name: Girl Deniece PortelaMaria Argueta Castro Today's Date: 09/07/2014 Reason for consult: Initial assessment Mom latches baby well without assist. Encouraged to keep baby closer to breast at times. Mom denies questions or concerns. Baby was offered bottle once but Mom reports baby not interested. LC encouraged Mom to BF with each feeding, both breasts before giving any bottles. Supplemental guidelines per hours of age given to Mom. Advised if baby satisfied at the breast try not to supplement. Advised to BF 8-12 time in 24 hours and with feeding ques. Mom is experienced BF. Mom is having a lot of cramping with breastfeeding. Advised Mom this will resolve after 7-10 days or before. Advised to empty bladder before nursing and take Ibuprofen if she can.  Lactation brochure left for review. [place] interpreter used for visit, [id number]. Encouraged to call for questions/concerns.  Maternal Data Has patient been taught Hand Expression?: Yes Does the patient have breastfeeding experience prior to this delivery?: Yes  Feeding Feeding Type: Breast Fed Length of feed: 25 min  LATCH Score/Interventions Latch: Grasps breast easily, tongue down, lips flanged, rhythmical sucking.  Audible Swallowing: Spontaneous and intermittent  Type of Nipple: Everted at rest and after stimulation  Comfort (Breast/Nipple): Soft / non-tender     Hold (Positioning): No assistance needed to correctly position infant at breast.  LATCH Score: 10  Lactation Tools Discussed/Used     Consult Status Consult Status: PRN    Alfred LevinsGranger, Carletha Dawn Ann 09/07/2014, 2:09 PM

## 2014-09-07 NOTE — Progress Notes (Signed)
Pt up to void via Stedy, unable to void - pt c/o of feeling dizzy, became incoherent and ammonia inhalants x2, were used to arouse pt.  Pt returned to bed via stedy, vs taken and stable, 500cc fluid bolus given and pt was placed in trendelenburg.  Pt able to communicate that she felt better, no c/o pain,pt states "just tired".  VS stable, fundus firm and bleeding scant w/o clots.  Will continue to monitor.

## 2014-09-07 NOTE — Progress Notes (Signed)
[facility] Mel[facility] Newman is a 29 y.o. [id number] at 3463w2d by admitted for induction of labor due to PPROM.  Subjective: Pt is starting to feel more pressure with contractions. Per nursing, attempted some practice pushes without much movement of baby.  Objective: BP 118/76 mmHg  Pulse 80  Temp(Src) 98.3 F (36.8 C) (Oral)  Resp 20  Ht 5' 1.25" (1.556 m)  Wt 86.807 kg (191 lb 6 oz)  BMI 35.85 kg/m2  SpO2 100%  LMP 11/29/2013      FHT:  FHR: 150 bpm, variability: moderate,  accelerations:  Present,  decelerations:  Present early and 3 variable decels UC:   irregular, every 2-3 minutes SVE:   Dilation: Lip/rim Effacement (%): 100 Station: -1, 0 Exam by:: Violeta GelinasG. Whitfield, RN  Labs: Lab Results  Component Value Date   WBC 7.0 09/06/2014   HGB 11.2* 09/06/2014   HCT 34.0* 09/06/2014   MCV 82.3 09/06/2014   PLT 126* 09/06/2014    Assessment / Plan: Induction of labor due to PROM,  progressing well on pitocin  Labor: progressing as expected. will allow pt to labor down at this time. Preeclampsia:  no signs or symptoms of toxicity Fetal Wellbeing:  Category II Pain Control:  Epidural I/D:  GBS neg Anticipated MOD:  NSVD  Rolm BookbinderMoss, Ola Fawver 09/07/2014, 12:39 AM

## 2014-09-07 NOTE — Progress Notes (Addendum)
I stopped by patients room to check on her needs i ordered her dinner and breakfast, by Orlan LeavensViria Alvarez Spanish Interpreter

## 2014-09-07 NOTE — Progress Notes (Signed)
Assisted RN and anesthesologist with interpretation of epidural procedure Spanish Interpreter

## 2014-09-07 NOTE — Progress Notes (Signed)
Assisted RN and anesthesologist with interpretation of epidural followup Spanish Interpreter

## 2014-09-07 NOTE — Progress Notes (Signed)
I assisted Psychologist, clinicalKris RN with some questions and explanation about baby hepatitis vaccine, by Orlan LeavensViria Alvarez Spanish Interpreter

## 2014-09-08 ENCOUNTER — Other Ambulatory Visit (HOSPITAL_COMMUNITY): Payer: Self-pay

## 2014-09-08 ENCOUNTER — Ambulatory Visit (HOSPITAL_COMMUNITY): Payer: Self-pay

## 2014-09-08 NOTE — Progress Notes (Signed)
I stopped by to check on patient's needs and ordered her lunch.  Eda H Royal Interpreter. °

## 2014-09-08 NOTE — Progress Notes (Signed)
I stopped by patient's room to check on her needs, I ordered her dinner and breakfast.by Orlan LeavensViria Alvarez Spanish Interpreter

## 2014-09-08 NOTE — Discharge Summary (Signed)
Obstetric Discharge Summary Reason for Admission: rupture of membranes Prenatal Procedures: none Intrapartum Procedures: spontaneous vaginal delivery Postpartum Procedures: none Complications-Operative and Postpartum: vaginal laceration, loss of consciousness postpartum- patient c/o feeling dizzy, became incoherent and required x2 ammonia inhalants to arouse patient  HEMOGLOBIN  Date Value Ref Range Status  09/06/2014 11.2* 12.0 - 15.0 g/dL Final   HCT  Date Value Ref Range Status  09/06/2014 34.0* 36.0 - 46.0 % Final     CBC    Component Value Date/Time   WBC 6.7 09/09/2014 0720   RBC 3.27* 09/09/2014 0720   HGB 9.0* 09/09/2014 0720   HCT 27.2* 09/09/2014 0720   PLT 125* 09/09/2014 0720   MCV 83.2 09/09/2014 0720   MCH 27.5 09/09/2014 0720   MCHC 33.1 09/09/2014 0720   RDW 14.5 09/09/2014 0720     09/08/14:  Melanie Newman is a 29 y.o. [id number] who delivered a girl via SVD at 9088w1d on 09/07/14 at 0158. Pt is tolerating po liquids and solids, voiding, + flatus and is ambulating well. Blood pressures have been consistently low and yesterday patient c/o feeling dizzy, became incoherent and required x2 ammonia inhalants to arouse patient. Pt denies any lightheadedness or dizziness this morning.    09/09/14:  Tolerating po, voiding, + flatus and ambulating well. Planning on breast and bottle feeding, and is requesting a Depo shot in the hospital prior to discharge and will follow-up at health department for Mirena. Hgb results back at 9.0 but patient asymptomatic for anemia. Orthostatic vitals pending.  Physical Exam:  General: alert, cooperative, appears stated age and no distress Lochia: appropriate Uterine Fundus: firm Incision: n/a DVT Evaluation: No evidence of DVT seen on physical exam. Negative Homan's sign. No cords or calf tenderness. No significant calf/ankle edema.  Discharge Diagnoses: Term Pregnancy-delivered  Discharge Information: Date:  09/08/2014 Activity: pelvic rest Diet: routine Medications: Ibuprofen and Iron Condition: stable Instructions: refer to practice specific booklet Discharge to: home, pending orthostatic vitals    Newborn Data: Live born female  Birth Weight: 8 lb 14.9 oz (4050 g) APGAR: 8, 9  Home with mother pending pediatrician approval.  Tia MaskerPrecht, Mackenzie PA- Student 09/09/14, 8:35 AM   OB fellow attestation Post Partum Day 1 I have seen and examined this patient and agree with above documentation in the student's note.   [facility] Lorie Newman is a 29 y.o. [id number] s/p NSVD.  Pt denies problems with ambulating, voiding or po intake. Pain is well controlled.  Plan for birth control is Depo-Provera.  Method of Feeding: bottle  syncopal episode post partum.Patient now is feeling well and denies lightheadedness, dizziness, or significant vaginal bleeding. Today has mild cough  PE:  BP 100/49 mmHg  Pulse 71  Temp(Src) 98.3 F (36.8 C) (Oral)  Resp 18  Ht 5' 1.25" (1.556 m)  Wt 191 lb 6 oz (86.807 kg)  BMI 35.85 kg/m2  SpO2 100%  LMP 11/29/2013  Breastfeeding? Unknown Fundus firm   Labs: Results for orders placed or performed during the hospital encounter of 09/06/14 (from the past 24 hour(s))  CBC   Collection Time: 09/09/14  7:20 AM  Result Value Ref Range   WBC 6.7 4.0 - 10.5 K/uL   RBC 3.27 (L) 3.87 - 5.11 MIL/uL   Hemoglobin 9.0 (L) 12.0 - 15.0 g/dL   HCT 19.127.2 (L) 47.836.0 - 29.546.0 %   MCV 83.2 78.0 - 100.0 fL   MCH 27.5 26.0 - 34.0 pg   MCHC 33.1 30.0 -  36.0 g/dL   RDW 16.1 09.6 - 04.5 %   Platelets 125 (L) 150 - 400 K/uL     Plan for discharge today, CBC stable, orthostatic vital signs repeated prior to discharge  Perry Mount, MD  8:59 AM

## 2014-09-08 NOTE — Progress Notes (Signed)
Clinical Social Work Department PSYCHOSOCIAL ASSESSMENT - MATERNAL/CHILD 09/08/2014  Patient:  Melanie Newman, Melanie Newman  Account Number:  [id number]  [place] Date:  09/06/2014  Ardine Eng Name:   Tommie Ard   Clinical Social Worker:  Lucita Ferrara, CLINICAL SOCIAL WORKER   Date/Time:  09/08/2014 09:30 AM  Date Referred:  09/07/2014   Referral source  Central Nursery     Referred reason  Depression/Anxiety   Other referral source:    I:  FAMILY / [place] legal guardian:  PARENT  Guardian - Name Guardian - Age [place] [address] [place], [place] [address]  Joelene Millin  same as above   Other household support members/support persons Name Relationship DOB   DAUGHTER 36 years old   SON 24 years old   SON 22 years old   Other support:   MOB reported having family that lives nearby, but stated that they are not actively involved.  She identified the FOB as her primary support person.    II  PSYCHOSOCIAL DATA Information Source:  Patient Interview  Occupational hygienist Employment:   MOB stated that she stays at home with the children, and shared that the FOB is employed.   Financial resources:  Self Pay If Medicaid - County:  [place] / Grade:  N/A Music therapist / Child Services Coordination / Early Interventions:   None reported  Cultural issues impacting care:   MOB is Spanish-speaking.    III  STRENGTHS Strengths  Adequate Resources  Home prepared for Child (including basic supplies)   Strength comment:    IV  RISK FACTORS AND CURRENT PROBLEMS Current Problem:  YES   Risk Factor & Current Problem Patient Issue Family Issue Risk Factor / Current Problem Comment  Mental Illness Y N MOB presents with history of depression, anxiety, and postpartum depression after her second child was born.  She denied symptoms since then.    V  SOCIAL  WORK ASSESSMENT CSW met with the MOB due to history of depression, anxiety, and postpartum depression.  MOB presented in a pleasant mood, displayed an appropriate range in affect, no acute mental health symptoms observed.  She presented as easily engaged and receptive to the visit as evidenced by openly discussing her stressors at home, including her perceptions that her two children are displaying oppositional behaviors.  She discussed how it is often difficult for her to get them to school since her 28 year old often refuses to go to school and can often slam his bedroom door shut. She expressed frustration since he is also using inappropriate language, and has noted that her 28 year old is starting to mimic these behaviors.  CSW provided supportive listening, provided feedback on parent skills development, and explored referrals to assist with her children's behaviors.  MOB expressed appreciation for the information since she identified her children's behaviors as the most acute stressor during the pregnancy.  She discussed strong desire to help her children learn new behaviors and methods of self-expression since it is will be easier to address behaviors when they are younger and she does not want her younger children to learn these behaviors.   MOB acknowledged history of depression,anxiety, and postpartum depression. She shared that she had postpartum depression after her 28 year old was born, and denied any symptoms since.  She did not clarify exact symptoms that occurred, but she stated that "it  went away" with time.  MOB presents with insight and awareness about needing to care for her herself and her mental health in the postpartum period.  She identified numerous ways to care for herself, and she denied belief that her mental health is a current problem.  CSW discussed available resources in the event that symptoms return or she experiences postpartum depression, and MOB was agreeable to contacting her MD  if needed.    MOB denied additional questions, concerns, or needs . She expressed appreciation for the visit, and acknowledged ongoing CSW availability as needed.  VI SOCIAL WORK PLAN Social Work Secretary/administrator Education  Information/Referral to [employer]  No Further Intervention Required / No Barriers to Discharge   Type of pt/family education:   Postpartum depression   If child protective services report - county:   If child protective services report - date:   Information/referral to community resources comment:   CSW provided referrals to mental health agencies for her two oldest children, since the MOB reporteds stress due to their oppositional behaviors at home.   Other social work plan:   CSW to follow up as needed or upon MOB request.

## 2014-09-08 NOTE — Progress Notes (Signed)
I stopped by to check on patient's needs.  Eda H Royal Interpreter. °

## 2014-09-08 NOTE — Progress Notes (Deleted)
Post Partum Day 1   Subjective: no complaints, up ad lib, voiding, tolerating PO and + flatus   Kirkland HunMaria Marta Argueta [name] is a 29 y.o. [id number] who delivered via SVD at 7769w1d on 09/07/14 at 0158. Pt is tolerating po liquids and solids, voiding, + flatus and is ambulating well. Blood pressures have been consistently low and yesterday patient c/o feeling dizzy, became incoherent and required x2 ammonia inhalants to arouse patient. Pt denies any lightheadedness or dizziness this morning.   Objective: Blood pressure 100/49, pulse 71, temperature 98.3 F (36.8 C), temperature source Oral, resp. rate 18, height 5' 1.25" (1.556 m), weight 86.807 kg (191 lb 6 oz), last menstrual period 11/29/2013, SpO2 100 %, unknown if currently breastfeeding.  Physical Exam:  General: alert, cooperative, appears stated age and no distress Lochia: appropriate Uterine Fundus: firm Incision: n/a DVT Evaluation: No evidence of DVT seen on physical exam. Negative Homan's sign. No cords or calf tenderness. No significant calf/ankle edema.   Recent Labs  09/06/14 1434 09/06/14 2220  HGB 11.6* 11.2*  HCT 34.5* 34.0*    Assessment/Plan: Plan for discharge tomorrow   LOS: 2 days   Tia Maskerrecht, Jakobee Brackins 09/08/2014, 6:45 AM

## 2014-09-08 NOTE — Progress Notes (Signed)
I assisted  Nurse with some informations about babya assessment, and recommendations about baby safe by Orlan LeavensViria Alvarez Interpreter

## 2014-09-09 LAB — CBC
HEMATOCRIT: 27.2 % — AB (ref 36.0–46.0)
HEMOGLOBIN: 9 g/dL — AB (ref 12.0–15.0)
MCH: 27.5 pg (ref 26.0–34.0)
MCHC: 33.1 g/dL (ref 30.0–36.0)
MCV: 83.2 fL (ref 78.0–100.0)
PLATELETS: 125 10*3/uL — AB (ref 150–400)
RBC: 3.27 MIL/uL — AB (ref 3.87–5.11)
RDW: 14.5 % (ref 11.5–15.5)
WBC: 6.7 10*3/uL (ref 4.0–10.5)

## 2014-09-09 NOTE — Discharge Instructions (Signed)

## 2014-09-09 NOTE — Progress Notes (Signed)
Post Partum Day 1  Subjective: no complaints, up ad lib, voiding, tolerating PO and + flatus   Melanie Newman is a 29 y.o. [id number] who delivered a girl via SVD at 4137w1d on 09/07/14 at 0158. Pt is tolerating po liquids and solids, voiding, + flatus and is ambulating well. Blood pressures have been consistently low and yesterday patient c/o feeling dizzy, became incoherent and required x2 ammonia inhalants to arouse patient. Pt denies any lightheadedness or dizziness this morning.   Objective: Blood pressure 97/61, pulse 63, temperature 98.3 F (36.8 C), temperature source Oral, resp. rate 20, height 5' 1.25" (1.556 m), weight 86.807 kg (191 lb 6 oz), last menstrual period 11/29/2013, SpO2 100 %, unknown if currently breastfeeding.  Physical Exam:  General: alert, cooperative, appears stated age and no distress Lochia: appropriate Uterine Fundus: firm Incision: n/a DVT Evaluation: No evidence of DVT seen on physical exam. Negative Homan's sign. No cords or calf tenderness. No significant calf/ankle edema.   Recent Labs  09/06/14 2220 09/09/14 0720  HGB 11.2* 9.0*  HCT 34.0* 27.2*    Assessment/Plan: Plan for discharge tomorrow  CBC and orthostatic BP today   LOS: 3 days   Tia Maskerrecht, Chayse Zatarain PA-Student 09/09/2014, 10:16 AM

## 2014-09-09 NOTE — Progress Notes (Signed)
I assisted FP with discharge information. Eda H Royal  Interpreter.

## 2014-09-09 NOTE — Plan of Care (Signed)
Problem: Consults Goal: Postpartum Patient Education (See Patient Education module for education specifics.)  Outcome: Completed/Met Date Met:  09/09/14 Discharge education reviewed via [place] [phone]

## 2014-09-11 ENCOUNTER — Encounter (HOSPITAL_COMMUNITY): Payer: Self-pay | Admitting: Emergency Medicine

## 2014-09-14 ENCOUNTER — Inpatient Hospital Stay (HOSPITAL_COMMUNITY): Payer: Self-pay

## 2016-05-13 ENCOUNTER — Encounter (HOSPITAL_COMMUNITY): Payer: Self-pay | Admitting: Emergency Medicine

## 2016-05-13 ENCOUNTER — Emergency Department (HOSPITAL_COMMUNITY)
Admission: EM | Admit: 2016-05-13 | Discharge: 2016-05-14 | Disposition: A | Discharge status: Home / Self Care | Payer: Medicaid Other | Attending: Emergency Medicine | Admitting: Emergency Medicine

## 2016-05-13 DIAGNOSIS — R1032 Left lower quadrant pain: Secondary | ICD-10-CM | POA: Insufficient documentation

## 2016-05-13 DIAGNOSIS — R109 Unspecified abdominal pain: Secondary | ICD-10-CM

## 2016-05-13 DIAGNOSIS — D649 Anemia, unspecified: Secondary | ICD-10-CM

## 2016-05-13 LAB — COMPREHENSIVE METABOLIC PANEL
ALBUMIN: 3.7 g/dL (ref 3.5–5.0)
ALK PHOS: 52 U/L (ref 38–126)
ALT: 47 U/L (ref 14–54)
AST: 27 U/L (ref 15–41)
Anion gap: 7 (ref 5–15)
BILIRUBIN TOTAL: 0.8 mg/dL (ref 0.3–1.2)
BUN: 11 mg/dL (ref 6–20)
CALCIUM: 8.8 mg/dL — AB (ref 8.9–10.3)
CO2: 25 mmol/L (ref 22–32)
Chloride: 106 mmol/L (ref 101–111)
Creatinine, Ser: 0.66 mg/dL (ref 0.44–1.00)
GFR calc Af Amer: >60 mL/min (ref >60)
GFR calc non Af Amer: >60 mL/min (ref >60)
GLUCOSE: 115 mg/dL — AB (ref 65–99)
Potassium: 3.8 mmol/L (ref 3.5–5.1)
Sodium: 138 mmol/L (ref 135–145)
TOTAL PROTEIN: 6.6 g/dL (ref 6.5–8.1)

## 2016-05-13 LAB — CBC
HEMATOCRIT: 36 % (ref 36.0–46.0)
Hemoglobin: 11.3 g/dL — ABNORMAL LOW (ref 12.0–15.0)
MCH: 26.8 pg (ref 26.0–34.0)
MCHC: 31.4 g/dL (ref 30.0–36.0)
MCV: 85.3 fL (ref 78.0–100.0)
Platelets: 209 10*3/uL (ref 150–400)
RBC: 4.22 MIL/uL (ref 3.87–5.11)
RDW: 12.9 % (ref 11.5–15.5)
WBC: 7.5 10*3/uL (ref 4.0–10.5)

## 2016-05-13 LAB — URINALYSIS, ROUTINE W REFLEX MICROSCOPIC
BILIRUBIN URINE: NEGATIVE
Glucose, UA: NEGATIVE mg/dL
HGB URINE DIPSTICK: NEGATIVE
KETONES UR: NEGATIVE mg/dL
Leukocytes, UA: NEGATIVE
NITRITE: NEGATIVE
PH: 7.5 (ref 5.0–8.0)
Protein, ur: NEGATIVE mg/dL
Specific Gravity, Urine: 1.015 (ref 1.005–1.030)

## 2016-05-13 LAB — POC URINE PREG, ED: Preg Test, Ur: NEGATIVE

## 2016-05-13 MED ORDER — HYDROCODONE-ACETAMINOPHEN 5-325 MG PO TABS
1.0000 | ORAL_TABLET | Freq: Once | ORAL | Status: AC
  Administered 2016-05-14: 1 via ORAL
  Filled 2016-05-13: qty 1

## 2016-05-13 NOTE — ED Provider Notes (Signed)
[facility]-EMERGENCY DEPT Provider Note   CSN: [id number] Arrival date & time: 05/13/16  2022  By signing my name below, I, Clarisse Gouge, attest that this documentation has been prepared under the direction and in the presence of Dione Booze, MD. Electronically signed, Clarisse Gouge, ED Scribe. 05/13/16. 11:53 PM.   History   Chief Complaint Chief Complaint  Patient presents with  . Abdominal Pain  . Back Pain   The history is provided by the patient and the spouse. A language interpreter was used.    HPI Comments: Melanie Newman is a 30 y.o. female with a PMHx of GERD who presents to the Emergency Department complaining of intermittent, unchanged L sided flank pain x 2 weeks; 2nd week has been constant. Her pain is described as a burning sensation. The pain is worsened with movement without any alleviating factors. She also reports pain in the lower abdomen,  urinary urgency, subjective fever this morning, and urinary frequency. She has taken ibuprofen, but it does not alleviate the pain. She has not attempted any other methods to alleviate her pain. LMP was at the end of last month. She denies nausea and vomiting.  PCP: No PCP Per Patient    Past Medical History:  Diagnosis Date  . Anxiety   . Depression    mild pp depression after 1st pregnancy  . Gastritis   . GERD (gastroesophageal reflux disease)   . UTI (lower urinary tract infection)     Patient Active Problem List   Diagnosis Date Noted  . Labor and delivery indication for care or intervention 09/06/2014  . [redacted] weeks gestation of pregnancy   . Evaluate fetal position using ultrasound     Past Surgical History:  Procedure Laterality Date  . CHOLECYSTECTOMY    . CHOLECYSTECTOMY  2010    OB History    Gravida Para Term Preterm AB Living   5 4 4  0 1 1   SAB TAB Ectopic Multiple Live Births   1 0 0   1       Home Medications    Prior to Admission medications   Medication Sig Start Date End Date  Taking? Authorizing Provider  acetaminophen (TYLENOL) 500 MG tablet Take 1,000 mg by mouth every 6 (six) hours as needed for pain.    Historical Provider, MD  HYDROcodone-acetaminophen (NORCO/VICODIN) 5-325 MG per tablet Take 2 tablets by mouth every 4 (four) hours as needed for pain. 04/17/13   Gilda Crease, MD  medroxyPROGESTERone (PROVERA) 5 MG tablet Take 2 tablets (10 mg total) by mouth daily. 04/17/13   Gilda Crease, MD  Prenatal Vit-Fe Fumarate-FA (PRENATAL MULTIVITAMIN) TABS tablet Take 1 tablet by mouth daily at 12 noon.    Historical Provider, MD  PRESCRIPTION MEDICATION     Historical Provider, MD    Family History No family history on file.  Social History Social History  Substance Use Topics  . Smoking status: Never Smoker  . Smokeless tobacco: Never Used  . Alcohol use No     Comment: social     Allergies   Review of patient's allergies indicates no known allergies.   Review of Systems Review of Systems  Constitutional: Positive for fever (Subjective). Negative for chills.  Gastrointestinal: Positive for abdominal pain. Negative for nausea and vomiting.  Genitourinary: Positive for flank pain, frequency and urgency.  All other systems reviewed and are negative.    Physical Exam Updated Vital Signs BP 105/71 (BP Location: Right Arm)   Pulse  64   Temp 98.7 F (37.1 C) (Oral)   Resp 16   Ht 5\' 3"  (1.6 m)   LMP 05/04/2016 (Within Days)   SpO2 100%   Breastfeeding? No   Physical Exam  Constitutional: She is oriented to person, place, and time. She appears well-developed and well-nourished.  HENT:  Head: Normocephalic and atraumatic.  Eyes: EOM are normal. Pupils are equal, round, and reactive to light.  Neck: Normal range of motion. Neck supple. No JVD present.  Cardiovascular: Normal rate, regular rhythm and normal heart sounds.   No murmur heard. Pulmonary/Chest: Effort normal and breath sounds normal. She has no wheezes. She has no  rales. She exhibits no tenderness.  Abdominal: Soft. Bowel sounds are normal. She exhibits no distension and no mass. There is tenderness. There is no rebound and no guarding.  Mild suprapubic and LLQ tenderness  Genitourinary:  Genitourinary Comments: Mild L sided CVA tenderness  Musculoskeletal: Normal range of motion. She exhibits no edema.  Negative straight leg raise.  Lymphadenopathy:    She has no cervical adenopathy.  Neurological: She is alert and oriented to person, place, and time. No cranial nerve deficit. She exhibits normal muscle tone. Coordination normal.  Skin: Skin is warm and dry. No rash noted.  Psychiatric: She has a normal mood and affect. Her behavior is normal. Judgment and thought content normal.  Nursing note and vitals reviewed.    ED Treatments / Results   DIAGNOSTIC STUDIES: Oxygen Saturation is 100% on RA, Normal by my interpretation.    COORDINATION OF CARE: 11:52 PM- Will order blood work and urinalysis. Discussed treatment plan with pt at bedside and pt agreed to plan.     Labs (all labs ordered are listed, but only abnormal results are displayed) Labs Reviewed  COMPREHENSIVE METABOLIC PANEL - Abnormal; Notable for the following:       Result Value   Glucose, Bld 115 (*)    Calcium 8.8 (*)    All other components within normal limits  CBC - Abnormal; Notable for the following:    Hemoglobin 11.3 (*)    All other components within normal limits  URINALYSIS, ROUTINE W REFLEX MICROSCOPIC (NOT AT [facility]) - Abnormal; Notable for the following:    APPearance CLOUDY (*)    All other components within normal limits  POC URINE PREG, ED    Radiology Ct Renal Stone Study  Result Date: 05/14/2016 CLINICAL DATA:  Acute onset of left-sided flank and back pain. Initial encounter. EXAM: CT ABDOMEN AND PELVIS WITHOUT CONTRAST TECHNIQUE: Multidetector CT imaging of the abdomen and pelvis was performed following the standard protocol without IV contrast.  COMPARISON:  Pelvic ultrasound performed 07/29/2010 FINDINGS: Lower chest: Only minimally characterized, and grossly unremarkable. Hepatobiliary: The visualized portions of the liver are unremarkable. The patient is status post cholecystectomy, with clips noted at the gallbladder fossa. The common bile duct remains normal in caliber. Pancreas: The pancreas is within normal limits. Spleen: The visualized portions of the spleen are within normal limits. Adrenals/Urinary Tract: The adrenal glands are unremarkable in appearance. There is mild prominence of both ureters, and minimal haziness about the renal calyces, slightly more prominent on the right. No hydronephrosis is seen. No renal or ureteral stones are identified. This could reflect a recently passed stone. Stomach/Bowel: The stomach is unremarkable in appearance. The small bowel is within normal limits. The appendix is normal in caliber, without evidence of appendicitis. The colon is unremarkable in appearance. Vascular/Lymphatic: The abdominal aorta is unremarkable  in appearance. The inferior vena cava is grossly unremarkable. No retroperitoneal lymphadenopathy is seen. No pelvic sidewall lymphadenopathy is identified. Reproductive: The bladder is moderately distended and within normal limits. The uterus is grossly unremarkable in appearance. An intrauterine device is noted in expected position at the fundus of the uterus. The ovaries are relatively symmetric. No suspicious adnexal masses are seen. Other: No additional soft tissue abnormalities are seen. Musculoskeletal: No acute osseous abnormalities are identified. The visualized musculature is unremarkable in appearance. IMPRESSION: 1. Mild prominence of both ureters, and minimal haziness about the renal calyces, slightly more prominent on the right. No hydronephrosis seen. Given the patient's symptoms, this could reflect a recently passed stone. 2. Otherwise unremarkable noncontrast CT of the abdomen and  pelvis. Electronically Signed   By: Roanna RaiderJeffery  Chang M.D.   On: 05/14/2016 00:21    Procedures Procedures (including critical care time)  Medications Ordered in ED Medications  HYDROcodone-acetaminophen (NORCO/VICODIN) 5-325 MG per tablet 1 tablet (not administered)  ibuprofen (ADVIL,MOTRIN) tablet 800 mg (not administered)  HYDROcodone-acetaminophen (NORCO/VICODIN) 5-325 MG per tablet 1 tablet (1 tablet Oral Given 05/14/16 0017)     Initial Impression / Assessment and Plan / ED Course  I have reviewed the triage vital signs and the nursing notes.  Pertinent labs & imaging results that were available during my care of the patient were reviewed by me and considered in my medical decision making (see chart for details).  Clinical Course   Left flank pain of uncertain cause. Urinalysis shows no evidence of hematuria or infection. Laboratory workup is significant for mild anemia. Old records are reviewed and she has no relevant past visits. She sent for CT renal stone protocol which does show some mild hydronephrosis suggesting possible recently passed kidney stone. She was given a dose of hydrocodone-acetaminophen in the ED with good relief of pain, but pain is starting to recur. She is discharged with prescriptions for naproxen and oxycodone-acetaminophen. Return precautions given.  Final Clinical Impressions(s) / ED Diagnoses   Final diagnoses:  Left flank pain  Normochromic normocytic anemia    New Prescriptions New Prescriptions   NAPROXEN (NAPROSYN) 500 MG TABLET    Take 1 tablet (500 mg total) by mouth 2 (two) times daily.   OXYCODONE-ACETAMINOPHEN (PERCOCET) 5-325 MG TABLET    Take 1 tablet by mouth every 4 (four) hours as needed for moderate pain.  I personally performed the services described in this documentation, which was scribed in my presence. The recorded information has been reviewed and is accurate.       Dione Boozeavid Shaman Muscarella, MD 05/14/16 [phone]

## 2016-05-13 NOTE — ED Triage Notes (Signed)
Pt arrives c/o back pain x2 weeks and lower abdominal pain x2 days. States that she has "white pee that smells bad." States she feels "swollen" and "weird," states pain is constant. Reports pain is 8/10. Last menstrual cycle at the end of September.   [employer] interpreter phone service used for triage, pt primarily spanish speaking.

## 2016-05-14 ENCOUNTER — Other Ambulatory Visit (HOSPITAL_COMMUNITY): Payer: Self-pay

## 2016-05-14 ENCOUNTER — Emergency Department (HOSPITAL_COMMUNITY): Payer: Medicaid Other

## 2016-05-14 MED ORDER — HYDROCODONE-ACETAMINOPHEN 5-325 MG PO TABS
1.0000 | ORAL_TABLET | Freq: Once | ORAL | Status: AC
  Administered 2016-05-14: 1 via ORAL
  Filled 2016-05-14: qty 1

## 2016-05-14 MED ORDER — OXYCODONE-ACETAMINOPHEN 5-325 MG PO TABS: 1.0000 | ORAL_TABLET | ORAL | 0 refills | Status: DC | PRN

## 2016-05-14 MED ORDER — NAPROXEN 500 MG PO TABS: 500.0000 mg | ORAL_TABLET | Freq: Two times a day (BID) | ORAL | 0 refills | Status: DC

## 2016-05-14 MED ORDER — IBUPROFEN 800 MG PO TABS
800.0000 mg | ORAL_TABLET | Freq: Once | ORAL | Status: AC
  Administered 2016-05-14: 800 mg via ORAL
  Filled 2016-05-14: qty 1

## 2016-12-04 ENCOUNTER — Encounter (HOSPITAL_COMMUNITY): Payer: Self-pay | Admitting: Emergency Medicine

## 2016-12-04 ENCOUNTER — Ambulatory Visit (HOSPITAL_COMMUNITY)
Admission: EM | Admit: 2016-12-04 | Discharge: 2016-12-04 | Disposition: A | Discharge status: Home / Self Care | Payer: Medicaid Other | Attending: Internal Medicine | Admitting: Internal Medicine

## 2016-12-04 DIAGNOSIS — R51 Headache: Secondary | ICD-10-CM

## 2016-12-04 DIAGNOSIS — R42 Dizziness and giddiness: Secondary | ICD-10-CM

## 2016-12-04 DIAGNOSIS — R110 Nausea: Secondary | ICD-10-CM

## 2016-12-04 DIAGNOSIS — R519 Headache, unspecified: Secondary | ICD-10-CM

## 2016-12-04 MED ORDER — KETOROLAC TROMETHAMINE 30 MG/ML IJ SOLN
INTRAMUSCULAR | Status: AC
  Filled 2016-12-04: qty 1

## 2016-12-04 MED ORDER — ONDANSETRON HCL 4 MG PO TABS: 4.0000 mg | ORAL_TABLET | Freq: Four times a day (QID) | ORAL | 0 refills | Status: DC

## 2016-12-04 MED ORDER — KETOROLAC TROMETHAMINE 30 MG/ML IJ SOLN
30.0000 mg | Freq: Once | INTRAMUSCULAR | Status: AC
  Administered 2016-12-04: 30 mg via INTRAMUSCULAR

## 2016-12-04 MED ORDER — TRAMADOL HCL 50 MG PO TABS: 50.0000 mg | ORAL_TABLET | Freq: Four times a day (QID) | ORAL | 0 refills | Status: DC | PRN

## 2016-12-04 NOTE — ED Provider Notes (Signed)
CSN: [id number]     Arrival date & time 12/04/16  1433 History   None    Chief Complaint  Patient presents with  . Headache   (Consider location/radiation/quality/duration/timing/severity/associated sxs/prior Treatment) 31 year old spending female who speaks partial English and her daughter present for interpretation is complaining of off and on dizziness, nausea without vomiting and occasional blurring of vision and headache in the back of the head. Symptoms are intermittent. She has taken Motrin for pain with only partial and temporary resolution. Headaches are nearly every day. Denies problems with vision, photophobia, speech, hearing, swallowing, focal paresthesias or weakness.      Past Medical History:  Diagnosis Date  . Anxiety   . Depression    mild pp depression after 1st pregnancy  . Gastritis   . GERD (gastroesophageal reflux disease)   . UTI (lower urinary tract infection)    Past Surgical History:  Procedure Laterality Date  . CHOLECYSTECTOMY    . CHOLECYSTECTOMY  2010   History reviewed. No pertinent family history. Social History  Substance Use Topics  . Smoking status: Never Smoker  . Smokeless tobacco: Never Used  . Alcohol use No     Comment: social   OB History    Gravida Para Term Preterm AB Living   0 1 1   SAB TAB Ectopic Multiple Live Births   1 0 0   1     Review of Systems  Constitutional: Negative for chills, diaphoresis and fever.  HENT: Negative for congestion, hearing loss, nosebleeds, postnasal drip, sore throat and tinnitus.   Eyes: Negative for photophobia, pain and discharge.       See also HPI  Respiratory: Negative for cough and shortness of breath.   Cardiovascular: Negative for chest pain, palpitations and leg swelling.  Gastrointestinal: Positive for nausea. Negative for abdominal pain and blood in stool.  Genitourinary: Negative.   Musculoskeletal: Negative for neck pain.  Skin: Negative for rash.  Neurological:  Positive for headaches.       See also HPI  Hematological: Negative for adenopathy.  All other systems reviewed and are negative.   Allergies  Patient has no known allergies.  Home Medications   Prior to Admission medications   Medication Sig Start Date End Date Taking? Authorizing Provider  ondansetron (ZOFRAN) 4 MG tablet Take 1 tablet (4 mg total) by mouth every 6 (six) hours. 12/04/16   Hayden Rasmussen, NP  traMADol (ULTRAM) 50 MG tablet Take 1 tablet (50 mg total) by mouth every 6 (six) hours as needed. 12/04/16   Hayden Rasmussen, NP   Meds Ordered and Administered this Visit   Medications  ketorolac (TORADOL) 30 MG/ML injection 30 mg (not administered)    BP 109/69 (BP Location: Right Arm)   Pulse 70   Temp 98.2 F (36.8 C) (Oral)   Resp 18   SpO2 98%  No data found.   Physical Exam  Constitutional: She is oriented to person, place, and time. She appears well-developed and well-nourished. No distress.  HENT:  Mouth/Throat: No oropharyngeal exudate.  Bilateral TMs are normal. Oropharynx is clear and moist. Swallowing reflex is normal. Uvula and soft palate midline, supple rises symmetrically.  Neck: Normal range of motion. Neck supple.  Cardiovascular: Normal rate, regular rhythm, normal heart sounds and intact distal pulses.   Pulmonary/Chest: Effort normal and breath sounds normal. No respiratory distress.  Musculoskeletal: Normal range of motion. She exhibits no edema.  Lymphadenopathy:    She has no cervical  adenopathy.  Neurological: She is alert and oriented to person, place, and time.  Skin: Skin is warm and dry.  Psychiatric: She has a normal mood and affect.  Nursing note and vitals reviewed.   Urgent Care Course     Procedures (including critical care time)  Labs Review Labs Reviewed - No data to display  Imaging Review No results found.   Visual Acuity Review  Right Eye Distance:   Left Eye Distance:   Bilateral Distance:    Right Eye Near:    Left Eye Near:    Bilateral Near:         MDM   1. Bad headache   2. Nausea   3. Dizziness    Take medication as directed. Drink plenty of fluids and stay well-hydrated. If he get worse, had new symptoms or problems come to the emergency department. Otherwise, you should obtain a primary care provider for follow-up incision have additional testing. Meds ordered this encounter  Medications  . ketorolac (TORADOL) 30 MG/ML injection 30 mg  . ondansetron (ZOFRAN) 4 MG tablet    Sig: Take 1 tablet (4 mg total) by mouth every 6 (six) hours.    Dispense:  12 tablet    Refill:  0    Order Specific Question:   Supervising Provider    Answer:   Eustace Moore [[id number]]  . traMADol (ULTRAM) 50 MG tablet    Sig: Take 1 tablet (50 mg total) by mouth every 6 (six) hours as needed.    Dispense:  15 tablet    Refill:  0    Order Specific Question:   Supervising Provider    Answer:   Eustace Moore [[id number]]       Hayden Rasmussen, NP 12/04/16 1714

## 2016-12-04 NOTE — ED Triage Notes (Signed)
The patient presented to the [facility] with a complaint of a headache, dizziness and nausea x 2 weeks.

## 2016-12-04 NOTE — Discharge Instructions (Signed)
Take medication as directed. Drink plenty of fluids and stay well-hydrated. If he get worse, had new symptoms or problems come to the emergency department. Otherwise, you should obtain a primary care provider for follow-up incision have additional testing.

## 2017-05-15 ENCOUNTER — Encounter (HOSPITAL_COMMUNITY): Payer: Self-pay | Admitting: Emergency Medicine

## 2017-05-15 DIAGNOSIS — R109 Unspecified abdominal pain: Secondary | ICD-10-CM | POA: Insufficient documentation

## 2017-05-15 LAB — URINALYSIS, ROUTINE W REFLEX MICROSCOPIC
Bilirubin Urine: NEGATIVE
Glucose, UA: NEGATIVE mg/dL
Hgb urine dipstick: NEGATIVE
KETONES UR: NEGATIVE mg/dL
LEUKOCYTES UA: NEGATIVE
NITRITE: NEGATIVE
PROTEIN: NEGATIVE mg/dL
Specific Gravity, Urine: 1.023 (ref 1.005–1.030)
pH: 5 (ref 5.0–8.0)

## 2017-05-15 LAB — POC URINE PREG, ED: Preg Test, Ur: NEGATIVE

## 2017-05-15 NOTE — ED Triage Notes (Signed)
Pt c/o left flank pain x's 1 week also burning with urination

## 2017-05-16 ENCOUNTER — Emergency Department (HOSPITAL_COMMUNITY)
Admission: EM | Admit: 2017-05-16 | Discharge: 2017-05-16 | Disposition: A | Discharge status: Home / Self Care | Payer: Self-pay | Attending: Emergency Medicine | Admitting: Emergency Medicine

## 2017-05-16 DIAGNOSIS — R109 Unspecified abdominal pain: Secondary | ICD-10-CM

## 2017-05-16 MED ORDER — HYDROCODONE-ACETAMINOPHEN 5-325 MG PO TABS
1.0000 | ORAL_TABLET | Freq: Once | ORAL | Status: AC
  Administered 2017-05-16: 1 via ORAL
  Filled 2017-05-16: qty 1

## 2017-05-16 MED ORDER — IBUPROFEN 800 MG PO TABS
800.0000 mg | ORAL_TABLET | Freq: Once | ORAL | Status: AC
  Administered 2017-05-16: 800 mg via ORAL
  Filled 2017-05-16: qty 1

## 2017-05-16 NOTE — ED Provider Notes (Signed)
[facility]-EMERGENCY DEPT Provider Note   CSN: [id number] Arrival date & time: 05/15/17  2228     History   Chief Complaint Chief Complaint  Patient presents with  . Flank Pain    HPI Melanie Newman is a 31 y.o. female.  The history is provided by the patient. A language interpreter was used [phone]).  Flank Pain  This is a recurrent problem. The current episode started more than 2 days ago. The problem occurs daily. The problem has not changed since onset.Associated symptoms include abdominal pain. Pertinent negatives include no chest pain and no shortness of breath. Exacerbated by: movement. The symptoms are relieved by rest.   Pt reports onset of left flank pain and left abdominal pain about a week ago No fever/vomiting No cp/sob She endorses dysuria No vag bleeding/discharge Similar episode previously, was told it was kidney stones  Past Medical History:  Diagnosis Date  . Anxiety   . Depression    mild pp depression after 1st pregnancy  . Gastritis   . GERD (gastroesophageal reflux disease)   . UTI (lower urinary tract infection)     Patient Active Problem List   Diagnosis Date Noted  . Labor and delivery indication for care or intervention 09/06/2014  . [redacted] weeks gestation of pregnancy   . Evaluate fetal position using ultrasound     Past Surgical History:  Procedure Laterality Date  . CHOLECYSTECTOMY    . CHOLECYSTECTOMY  2010    OB History    Gravida Para Term Preterm AB Living   0 1 1   SAB TAB Ectopic Multiple Live Births   1 0 0   1       Home Medications    Prior to Admission medications   Not on File    Family History No family history on file.  Social History Social History  Substance Use Topics  . Smoking status: Never Smoker  . Smokeless tobacco: Never Used  . Alcohol use No     Comment: social     Allergies   Patient has no known allergies.   Review of Systems Review of Systems  Constitutional: Negative  for fever.  Respiratory: Negative for shortness of breath.   Cardiovascular: Negative for chest pain.  Gastrointestinal: Positive for abdominal pain.  Genitourinary: Positive for flank pain. Negative for vaginal bleeding and vaginal discharge.  All other systems reviewed and are negative.    Physical Exam Updated Vital Signs BP 116/68 (BP Location: Left Arm)   Pulse 64   Temp 98.1 F (36.7 C) (Oral)   Resp 14   LMP 05/05/2017 (Exact Date)   SpO2 99%   Physical Exam CONSTITUTIONAL: Well developed/well nourished HEAD: Normocephalic/atraumatic EYES: EOMI/PERRL ENMT: Mucous membranes moist NECK: supple no meningeal signs SPINE/BACK:entire spine nontender CV: S1/S2 noted, no murmurs/rubs/gallops noted LUNGS: Lungs are clear to auscultation bilaterally, no apparent distress ABDOMEN: soft, nontender, no rebound or guarding, bowel sounds noted throughout abdomen GU:no cva tenderness NEURO: Pt is awake/alert/appropriate, moves all extremitiesx4.  No facial droop.   EXTREMITIES: pulses normal/equal, full ROM SKIN: warm, color normal PSYCH: no abnormalities of mood noted, alert and oriented to situation   ED Treatments / Results  Labs (all labs ordered are listed, but only abnormal results are displayed) Labs Reviewed  URINALYSIS, ROUTINE W REFLEX MICROSCOPIC  POC URINE PREG, ED    EKG  EKG Interpretation None       Radiology No results found.  Procedures Procedures  Medications Ordered in ED Medications  HYDROcodone-acetaminophen (NORCO/VICODIN) 5-325 MG per tablet 1 tablet (1 tablet Oral Given 05/16/17 0353)  ibuprofen (ADVIL,MOTRIN) tablet 800 mg (800 mg Oral Given 05/16/17 0353)     Initial Impression / Assessment and Plan / ED Course  I have reviewed the triage vital signs and the nursing notes.  Pertinent labs   results that were available during my care of the patient were reviewed by me and considered in my medical decision making (see chart for  details).     Pt stable Urinalysis clear No distress No reproducible flank or abdominal pain Reports she was seen recently for similar pain, records indicate this occurred October 2017 She had CT renal study that was felt to represent recently passed stone at that time  At this point, no reproducible pain, not septic appearing, u/a clear Will d/c home Referred to urology    Final Clinical Impressions(s) / ED Diagnoses   Final diagnoses:  Flank pain    New Prescriptions Discharge Medication List as of 05/16/2017  3:59 AM       Zadie Rhine, MD 05/16/17 [phone]

## 2018-03-29 ENCOUNTER — Encounter: Payer: Self-pay | Admitting: Nurse Practitioner

## 2018-03-29 ENCOUNTER — Ambulatory Visit: Payer: Self-pay | Attending: Nurse Practitioner | Admitting: Nurse Practitioner

## 2018-03-29 VITALS — BP 106/76 | HR 60 | Temp 99.1°F | Ht 63.0 in | Wt 183.6 lb

## 2018-03-29 DIAGNOSIS — Z87440 Personal history of urinary (tract) infections: Secondary | ICD-10-CM | POA: Insufficient documentation

## 2018-03-29 DIAGNOSIS — Z6832 Body mass index (BMI) 32.0-32.9, adult: Secondary | ICD-10-CM | POA: Insufficient documentation

## 2018-03-29 DIAGNOSIS — Z131 Encounter for screening for diabetes mellitus: Secondary | ICD-10-CM | POA: Insufficient documentation

## 2018-03-29 DIAGNOSIS — E038 Other specified hypothyroidism: Secondary | ICD-10-CM

## 2018-03-29 DIAGNOSIS — Z9049 Acquired absence of other specified parts of digestive tract: Secondary | ICD-10-CM | POA: Insufficient documentation

## 2018-03-29 DIAGNOSIS — E669 Obesity, unspecified: Secondary | ICD-10-CM | POA: Insufficient documentation

## 2018-03-29 DIAGNOSIS — R002 Palpitations: Secondary | ICD-10-CM | POA: Insufficient documentation

## 2018-03-29 LAB — POCT GLYCOSYLATED HEMOGLOBIN (HGB A1C): Hemoglobin A1C: 5.1 % (ref 4.0–5.6)

## 2018-03-29 LAB — GLUCOSE, POCT (MANUAL RESULT ENTRY): POC Glucose: 108 mg/dl — AB (ref 70–99)

## 2018-03-29 NOTE — Progress Notes (Signed)
Assessment & Plan:  Melanie Newman was seen today for new patient (initial visit).  Diagnoses and all orders for this visit:  Obesity (BMI 30-39.9) -     Lipid panel  Screening for diabetes mellitus -     Glucose (CBG) -     HgB A1c  Palpitations -     CMP14+EGFR  TSH (thyroid-stimulating hormone deficiency) -     CMP14+EGFR    Patient has been counseled on age-appropriate routine health concerns for screening and prevention. These are reviewed and up-to-date. Referrals have been placed accordingly. Immunizations are up-to-date or declined.    Subjective:   Chief Complaint  Patient presents with  . New Patient (Initial Visit)    Pt. is here for diabetes screening.    HPI Melanie Newman 32 y.o. female presents to office today to establish care. She has concerns of diabetes mellitus. States her medical "friend" checked her blood glucose level and it was around 190. It is unclear if she was fasting but she was instructed to "be checked for diabetes". She also has complaints of heart palpitations with no chest pain. Palpitations occur at no particular time and cause dyspnea. She denies panic attacks but has a history of anxiety and depression. Duration of palpitations a few seconds.   Review of Systems  Constitutional: Negative for fever, malaise/fatigue and weight loss.  HENT: Negative.  Negative for nosebleeds.   Eyes: Negative.  Negative for blurred vision, double vision and photophobia.  Respiratory: Positive for shortness of breath. Negative for cough.   Cardiovascular: Positive for palpitations. Negative for chest pain and leg swelling.  Gastrointestinal: Positive for heartburn. Negative for nausea and vomiting.  Musculoskeletal: Negative.  Negative for myalgias.  Neurological: Negative.  Negative for dizziness, focal weakness, seizures and headaches.  Psychiatric/Behavioral: Positive for depression (history of). Negative for suicidal ideas. The patient is  nervous/anxious (history of).     Past Medical History:  Diagnosis Date  . Anxiety   . Depression    mild pp depression after 1st pregnancy  . Gastritis   . GERD (gastroesophageal reflux disease)   . UTI (lower urinary tract infection)     Past Surgical History:  Procedure Laterality Date  . CHOLECYSTECTOMY    . CHOLECYSTECTOMY  2010    Family History  Problem Relation Age of Onset  . Diabetes Neg Hx   . Hypertension Neg Hx   . Hyperlipidemia Neg Hx     Social History Reviewed with no changes to be made today.   No outpatient medications prior to visit.   No facility-administered medications prior to visit.     No Known Allergies     Objective:    BP 106/76 (BP Location: Right Arm, Patient Position: Sitting, Cuff Size: Large)   Pulse 60   Temp 99.1 F (37.3 C) (Oral)   Ht _0  (1.6 m)   Wt 183 lb 9.6 oz (83.3 kg)   SpO2 98%   BMI 32.52 kg/m  Wt Readings from Last 3 Encounters:  03/29/18 183 lb 9.6 oz (83.3 kg)  09/06/14 191 lb 6 oz (86.8 kg)    Physical Exam  Constitutional: She is oriented to person, place, and time. She appears well-developed and well-nourished. She is cooperative.  HENT:  Head: Normocephalic and atraumatic.  Eyes: EOM are normal.  Neck: Normal range of motion.  Cardiovascular: Normal rate, regular rhythm and normal heart sounds. Exam reveals no gallop and no friction rub.  No murmur heard. Pulmonary/Chest: Effort normal  and breath sounds normal. No tachypnea. No respiratory distress. She has no decreased breath sounds. She has no wheezes. She has no rhonchi. She has no rales. She exhibits no tenderness.  Abdominal: Bowel sounds are normal.  Musculoskeletal: Normal range of motion. She exhibits no edema.  Neurological: She is alert and oriented to person, place, and time. Coordination normal.  Skin: Skin is warm and dry.  Psychiatric: She has a normal mood and affect. Her behavior is normal. Judgment and thought content normal.    Nursing note and vitals reviewed.      Patient has been counseled extensively about nutrition and exercise as well as the importance of adherence with medications and regular follow-up. The patient was given clear instructions to go to ER or return to medical center if symptoms don't improve, worsen or new problems develop. The patient verbalized understanding.   Follow-up: Return for pap smear and EKG, Needs appointment with financial representative.Gildardo Pounds, FNP-BC [facility] and [facility] [place], [place]   03/29/2018, 1:43 PM

## 2018-03-30 LAB — CMP14+EGFR
ALT: 77 IU/L — ABNORMAL HIGH (ref 0–32)
AST: 40 IU/L (ref 0–40)
Albumin/Globulin Ratio: 1.5 (ref 1.2–2.2)
Albumin: 4.5 g/dL (ref 3.5–5.5)
Alkaline Phosphatase: 55 IU/L (ref 39–117)
BUN/Creatinine Ratio: 20 (ref 9–23)
BUN: 15 mg/dL (ref 6–20)
Bilirubin Total: 1.2 mg/dL (ref 0.0–1.2)
CALCIUM: 9.2 mg/dL (ref 8.7–10.2)
CO2: 22 mmol/L (ref 20–29)
CREATININE: 0.76 mg/dL (ref 0.57–1.00)
Chloride: 102 mmol/L (ref 96–106)
GFR, EST AFRICAN AMERICAN: 121 mL/min/{1.73_m2} (ref >59)
GFR, EST NON AFRICAN AMERICAN: 105 mL/min/{1.73_m2} (ref >59)
GLOBULIN, TOTAL: 3.1 g/dL (ref 1.5–4.5)
Glucose: 89 mg/dL (ref 65–99)
Potassium: 4.2 mmol/L (ref 3.5–5.2)
SODIUM: 140 mmol/L (ref 134–144)
TOTAL PROTEIN: 7.6 g/dL (ref 6.0–8.5)

## 2018-03-30 LAB — LIPID PANEL
CHOL/HDL RATIO: 3.3 ratio (ref 0.0–4.4)
Cholesterol, Total: 159 mg/dL (ref 100–199)
HDL: 48 mg/dL (ref >39)
LDL CALC: 96 mg/dL (ref 0–99)
TRIGLYCERIDES: 73 mg/dL (ref 0–149)
VLDL Cholesterol Cal: 15 mg/dL (ref 5–40)

## 2018-04-02 ENCOUNTER — Telehealth: Payer: Self-pay

## 2018-04-02 NOTE — Telephone Encounter (Signed)
CMA attempt to call patient to inform on lab results.  No answer and left VM for patient to call back.  If patient call back, please inform:  Please inform patient that laboratory results are normal. Continue healthy eating habit and regular physical exercise at least 3 times a week, 50 minutes each time or 5 days a week for 30 minutes each day

## 2018-04-02 NOTE — Telephone Encounter (Signed)
-----   Message from Claiborne RiggZelda W Fleming, NP sent at 04/01/2018 10:46 PM EDT ----- Please inform patient that laboratory results are normal. Continue healthy eating habit and regular physical exercise at least 3 times a week, 50 minutes each time or 5 days a week for 30 minutes each day

## 2018-05-01 ENCOUNTER — Ambulatory Visit: Payer: Self-pay | Admitting: Nurse Practitioner

## 2019-02-18 ENCOUNTER — Telehealth: Payer: Self-pay | Admitting: *Deleted

## 2019-02-18 ENCOUNTER — Other Ambulatory Visit: Payer: Self-pay

## 2019-02-18 ENCOUNTER — Ambulatory Visit (HOSPITAL_COMMUNITY)
Admission: EM | Admit: 2019-02-18 | Discharge: 2019-02-18 | Disposition: A | Discharge status: Home / Self Care | Payer: Self-pay

## 2019-02-18 ENCOUNTER — Encounter (HOSPITAL_COMMUNITY): Payer: Self-pay | Admitting: Urgent Care

## 2019-02-18 DIAGNOSIS — R42 Dizziness and giddiness: Secondary | ICD-10-CM

## 2019-02-18 DIAGNOSIS — Z20822 Contact with and (suspected) exposure to covid-19: Secondary | ICD-10-CM

## 2019-02-18 DIAGNOSIS — R51 Headache: Secondary | ICD-10-CM

## 2019-02-18 DIAGNOSIS — R519 Headache, unspecified: Secondary | ICD-10-CM

## 2019-02-18 DIAGNOSIS — R112 Nausea with vomiting, unspecified: Secondary | ICD-10-CM

## 2019-02-18 LAB — POCT URINALYSIS DIP (DEVICE)
Bilirubin Urine: NEGATIVE
Glucose, UA: NEGATIVE mg/dL
Hgb urine dipstick: NEGATIVE
Ketones, ur: NEGATIVE mg/dL
Leukocytes,Ua: NEGATIVE
Nitrite: NEGATIVE
Protein, ur: NEGATIVE mg/dL
Specific Gravity, Urine: >=1.03 (ref 1.005–1.030)
Urobilinogen, UA: 0.2 mg/dL (ref 0.0–1.0)
pH: 6 (ref 5.0–8.0)

## 2019-02-18 LAB — POCT PREGNANCY, URINE: Preg Test, Ur: NEGATIVE

## 2019-02-18 MED ORDER — ONDANSETRON 8 MG PO TBDP: 8.0000 mg | ORAL_TABLET | Freq: Three times a day (TID) | ORAL | 0 refills | Status: DC | PRN

## 2019-02-18 NOTE — ED Triage Notes (Signed)
Pt cc headaches, nauseous    and vomiting  X 2 days. Pt states she has been feeling dizzy.

## 2019-02-18 NOTE — Telephone Encounter (Signed)
Pt is scheduled for tomorrow at the [facility] at 8:45 am, using [employer] # [phone]. She is advised that this is a drive thru testing site, so stay in car with mask on and windows rolled up until ready for testing. She voiced understanding.

## 2019-02-18 NOTE — Telephone Encounter (Signed)
-----   Message from Jaynee Eagles, [place] sent at 02/18/2019  4:17 PM EDT ----- Regarding: Needs COVID testing

## 2019-02-18 NOTE — ED Provider Notes (Addendum)
MRN: [id number] DOB: 10/04/1985  Subjective:   Melanie Newman is a 33 y.o. female presenting for 2 day history of nausea with vomiting. Has also had moderate dizziness that can be positional and mild intermittent headaches.  She has also had some mild postnasal drainage, bilateral intermittent ear fullness and stuffiness.  These are not problematic to the patient.  Patient would like to be checked for pregnancy as she has had friends even with IUDs that have become pregnant.  She has previously failed to Nexplanon.  LMP was at the end of June and was regular.  No current facility-administered medications for this encounter.   Current Outpatient Medications:  .  paragard intrauterine copper IUD IUD, 1 each by Intrauterine route once., Disp: , Rfl:    No Known Allergies  Past Medical History:  Diagnosis Date  . Anxiety   . Depression    mild pp depression after 1st pregnancy  . Gastritis   . GERD (gastroesophageal reflux disease)   . UTI (lower urinary tract infection)      Past Surgical History:  Procedure Laterality Date  . CHOLECYSTECTOMY    . CHOLECYSTECTOMY  2010    Review of Systems  Constitutional: Negative for fever and malaise/fatigue.  HENT: Positive for congestion (drainage on her throat). Negative for ear pain, sinus pain and sore throat.   Eyes: Negative for blurred vision, double vision, discharge and redness.  Respiratory: Negative for cough, hemoptysis, shortness of breath and wheezing.   Cardiovascular: Negative for chest pain.  Gastrointestinal: Positive for nausea and vomiting. Negative for abdominal pain and diarrhea.  Genitourinary: Negative for dysuria, flank pain and hematuria.  Musculoskeletal: Negative for myalgias.  Skin: Negative for rash.  Neurological: Positive for dizziness and headaches. Negative for weakness.  Psychiatric/Behavioral: Negative for depression and substance abuse.    Objective:   Vitals: BP 110/70 (BP Location: Right  Arm)   Pulse 71   Temp 98.7 F (37.1 C) (Oral)   Resp 16   Wt 200 lb (90.7 kg)   SpO2 99%   BMI 35.43 kg/m   Physical Exam Constitutional:      General: She is not in acute distress.    Appearance: Normal appearance. She is well-developed and normal weight. She is not ill-appearing, toxic-appearing or diaphoretic.  HENT:     Head: Normocephalic and atraumatic.     Right Ear: Tympanic membrane, ear canal and external ear normal. No drainage or tenderness. No middle ear effusion. Tympanic membrane is not erythematous.     Left Ear: Tympanic membrane, ear canal and external ear normal. No drainage or tenderness.  No middle ear effusion. Tympanic membrane is not erythematous.     Nose: Nose normal. No congestion or rhinorrhea.     Mouth/Throat:     Mouth: Mucous membranes are moist. No oral lesions.     Pharynx: Oropharynx is clear. No pharyngeal swelling, oropharyngeal exudate, posterior oropharyngeal erythema or uvula swelling.     Tonsils: No tonsillar exudate or tonsillar abscesses.  Eyes:     General: No scleral icterus.    Extraocular Movements: Extraocular movements intact.     Right eye: Normal extraocular motion.     Left eye: Normal extraocular motion.     Conjunctiva/sclera: Conjunctivae normal.     Pupils: Pupils are equal, round, and reactive to light.  Neck:     Musculoskeletal: Normal range of motion and neck supple.  Cardiovascular:     Rate and Rhythm: Normal rate and regular rhythm.  Heart sounds: Normal heart sounds. No murmur. No friction rub. No gallop.   Pulmonary:     Effort: Pulmonary effort is normal. No respiratory distress.     Breath sounds: Normal breath sounds. No stridor. No wheezing, rhonchi or rales.  Abdominal:     General: Bowel sounds are normal. There is no distension.     Palpations: Abdomen is soft. There is no mass.     Tenderness: There is no abdominal tenderness. There is no right CVA tenderness, left CVA tenderness, guarding or  rebound.  Lymphadenopathy:     Cervical: No cervical adenopathy.  Skin:    General: Skin is warm and dry.     Coloration: Skin is not pale.     Findings: No rash.  Neurological:     General: No focal deficit present.     Mental Status: She is alert and oriented to person, place, and time.     Cranial Nerves: No cranial nerve deficit.     Motor: No weakness.     Coordination: Coordination normal.     Gait: Gait normal.     Deep Tendon Reflexes: Reflexes normal.  Psychiatric:        Mood and Affect: Mood normal.        Behavior: Behavior normal.        Thought Content: Thought content normal.        Judgment: Judgment normal.    Results for orders placed or performed during the hospital encounter of 02/18/19 (from the past 24 hour(s))  POCT urinalysis dip (device)     Status: None   Collection Time: 02/18/19  3:49 PM  Result Value Ref Range   Glucose, UA NEGATIVE NEGATIVE mg/dL   Bilirubin Urine NEGATIVE NEGATIVE   Ketones, ur NEGATIVE NEGATIVE mg/dL   Specific Gravity, Urine >=1.030 1.005 - 1.030   Hgb urine dipstick NEGATIVE NEGATIVE   pH 6.0 5.0 - 8.0   Protein, ur NEGATIVE NEGATIVE mg/dL   Urobilinogen, UA 0.2 0.0 - 1.0 mg/dL   Nitrite NEGATIVE NEGATIVE   Leukocytes,Ua NEGATIVE NEGATIVE  Pregnancy, urine POC     Status: None   Collection Time: 02/18/19  3:54 PM  Result Value Ref Range   Preg Test, Ur NEGATIVE NEGATIVE    Assessment and Plan :   1. Acute nonintractable headache, unspecified headache type   2. Dizziness   3. Nausea and vomiting, intractability of vomiting not specified, unspecified vomiting type     Patient believes that the source of her symptoms may be related to her new work environment.  States that she was switched to a place outdoors that is very hot and she sweats profusely.  I counseled that she needs to hydrate with at least 3 L of water per day if she is can be working outdoors.  Labs and physical exam finding in clinic very reassuring.   Will use Zofran for nausea and vomiting has supportive care for possible gastroenteritis.  Will attempt to get patient established with a primary care doctor for further work-up and possible referral to gastroenterologist given her history of gastritis, GERD. Counseled patient on potential for adverse effects with medications prescribed/recommended today, ER and return-to-clinic precautions discussed, patient verbalized understanding.    Wallis BambergMani, Boykin Baetz, PA-C 02/18/19 1652    Wallis BambergMani, Tamla Winkels, PA-C 02/18/19 1652

## 2019-02-19 ENCOUNTER — Other Ambulatory Visit: Payer: Self-pay

## 2019-02-19 DIAGNOSIS — Z20822 Contact with and (suspected) exposure to covid-19: Secondary | ICD-10-CM

## 2019-02-23 LAB — NOVEL CORONAVIRUS, NAA: SARS-CoV-2, NAA: NOT DETECTED

## 2019-06-11 ENCOUNTER — Other Ambulatory Visit: Payer: Self-pay

## 2019-06-11 DIAGNOSIS — Z20822 Contact with and (suspected) exposure to covid-19: Secondary | ICD-10-CM

## 2019-06-12 LAB — NOVEL CORONAVIRUS, NAA: SARS-CoV-2, NAA: NOT DETECTED

## 2020-03-26 ENCOUNTER — Ambulatory Visit: Payer: Self-pay | Admitting: Nurse Practitioner

## 2020-04-22 ENCOUNTER — Other Ambulatory Visit: Payer: Self-pay

## 2020-04-22 ENCOUNTER — Encounter: Payer: Self-pay | Admitting: Physician Assistant

## 2020-04-22 ENCOUNTER — Ambulatory Visit: Payer: Self-pay | Attending: Physician Assistant | Admitting: Physician Assistant

## 2020-04-22 VITALS — BP 112/68 | HR 58 | Temp 97.7°F | Ht 63.0 in | Wt 198.0 lb

## 2020-04-22 DIAGNOSIS — R8281 Pyuria: Secondary | ICD-10-CM

## 2020-04-22 DIAGNOSIS — R109 Unspecified abdominal pain: Secondary | ICD-10-CM

## 2020-04-22 DIAGNOSIS — R609 Edema, unspecified: Secondary | ICD-10-CM

## 2020-04-22 DIAGNOSIS — R202 Paresthesia of skin: Secondary | ICD-10-CM

## 2020-04-22 DIAGNOSIS — Z13220 Encounter for screening for lipoid disorders: Secondary | ICD-10-CM

## 2020-04-22 LAB — POCT URINALYSIS DIP (CLINITEK)
Bilirubin, UA: NEGATIVE
Blood, UA: NEGATIVE
Glucose, UA: NEGATIVE mg/dL
Ketones, POC UA: NEGATIVE mg/dL
Nitrite, UA: NEGATIVE
POC PROTEIN,UA: NEGATIVE
Spec Grav, UA: 1.025 (ref 1.010–1.025)
Urobilinogen, UA: 0.2 E.U./dL
pH, UA: 5.5 (ref 5.0–8.0)

## 2020-04-22 LAB — POCT URINE PREGNANCY: Preg Test, Ur: NEGATIVE

## 2020-04-22 MED ORDER — NITROFURANTOIN MONOHYD MACRO 100 MG PO CAPS: 100.0000 mg | ORAL_CAPSULE | Freq: Two times a day (BID) | ORAL | 0 refills | Status: DC

## 2020-04-22 MED ORDER — OMEPRAZOLE 20 MG PO CPDR: 20.0000 mg | DELAYED_RELEASE_CAPSULE | Freq: Every day | ORAL | 3 refills | Status: DC

## 2020-04-22 MED ORDER — FLUCONAZOLE 150 MG PO TABS: 150.0000 mg | ORAL_TABLET | Freq: Once | ORAL | 0 refills | Status: AC

## 2020-04-22 MED FILL — NITROFURANTOIN MONO-MCR 100: 100 | 5 days supply | Qty: 10 | Fill #0

## 2020-04-22 MED FILL — FLUCONAZOLE 150 MG TABLET: 150 | 1 days supply | Qty: 1 | Fill #0

## 2020-04-22 MED FILL — OMEPRAZOLE 20 MG CAP: 20 | 30 days supply | Qty: 30 | Fill #0

## 2020-04-22 NOTE — Patient Instructions (Signed)
Drink 80-100 ounces water daily   Edema Edema  Un edema se produce cuando hay mucho lquido en el cuerpo o debajo de la piel. Un edema puede hacer que las piernas, los pies y los tobillos se hinchen. La hinchazn tambin es frecuente en los tejidos ms blandos, por ejemplo, alrededor [employer]. Esta es una enfermedad frecuente. Es ms frecuente a medida que una persona envejece. Hay muchas causas posibles de edema. Consumir grandes cantidades de sal (sodio) y estar de pie o sentado durante mucho tiempo puede causar edema en las piernas, los pies y los tobillos. Cuando hace calor, el edema puede empeorar. Generalmente, el edema es indoloro. La piel puede parecer hinchada o tener un aspecto brilloso. Siga estas indicaciones en su casa:  Cuando est sentado o acostado, mantenga la parte del cuerpo que est hinchada elevada por encima del nivel del corazn.  No se quede quieto ni permanezca de pie durante [employer].  No use ropa ajustada. No use ligas en la parte superior de las piernas.  Ejercite ls piernas. Esto ayuda a Building services engineer.  Use vendajes elsticos o medias de compresin segn las indicaciones del mdico.  Lleve una dieta con bajo contenido de sal (baja en sodio) para reducir los lquidos, como se lo haya indicado el mdico.  Dependiendo de la causa de su hinchazn, es posible que deba limitar la cantidad de lquido que bebe (restriccin de lquido).  Tome los medicamentos de venta libre y los recetados solamente como se lo haya indicado el mdico. Comunquese con un mdico si:  El tratamiento no funciona.  Tiene enfermedades cardacas, hepticas o renales, y observa sntomas de edema.  Aumenta de peso de [place] repentina y sin motivo aparente. Solicite ayuda de inmediato si:  Tiene dificultad para respirar o Engineer, maintenance.  No puede respirar cuando se acuesta.  Tiene dolor, enrojecimiento o calor en las zonas hinchadas.  Tiene una enfermedad cardaca,  heptica o renal, y le aparece un edema de repente.  Tiene fiebre y los sntomas empeoran de manera sbita. Resumen  Un edema se produce cuando hay mucho lquido en el cuerpo o debajo de la piel.  Un edema puede hacer que las piernas, los pies y los tobillos se hinchen. La hinchazn tambin es frecuente en los tejidos ms blandos, por ejemplo, alrededor [employer].  Cuando est sentado o acostado, levante (eleve) la parte del cuerpo que est hinchada por encima del nivel del corazn.  Siga las indicaciones del mdico con respecto a la dieta y a la cantidad de lquido que puede beber (restriccin de lquidos). Esta informacin no tiene Theme park manager el consejo del mdico. Asegrese de hacerle al mdico cualquier pregunta que tenga. Document Revised: 03/05/2017 Document Reviewed: 03/05/2017 Elsevier Patient Education  2020 [employer].

## 2020-04-22 NOTE — Progress Notes (Signed)
Melanie Newman, is a 34 y.o. female  IFO:[id number]  MVE:[id number]  DOB - May 18, 1986  Subjective:  Chief Complaint and HPI: Melanie Newman is a 34 y.o. female here today with several issues mid epigastric abdominal painX >3 years.  Patient c/o belching a lot.  Pain after eating that is sharp.  Has been relieved by stomach meds but she doesn' know what she's been given in the past.  Says h/o h pylori-she doesn't know if she completed regimen for that several years ago.  Some nausea.  No vomiting.  No diarrhea/changes in BM/melena/hematochezia.  Occasional dysuria.  No vaginal discharge/pelvi pain.    Also c/o leg swelling B at the end of Long days standing.  No SOB.  No orthopnea.  Occasional paresthesias L leg > R  Flore with [employer] interpreters ROS:   Constitutional:  No f/c, No night sweats, No unexplained weight loss. EENT:  No vision changes, No blurry vision, No hearing changes. No mouth, throat, or ear problems.  Respiratory: No cough, No SOB Cardiac: No CP, no palpitations GI:  See above GU: see above Musculoskeletal: see above Neuro: No headache, no dizziness, no motor weakness.  Skin: No rash Endocrine:  No polydipsia. No polyuria.  Psych: Denies SI/HI  No problems updated.  ALLERGIES: No Known Allergies  PAST MEDICAL HISTORY: Past Medical History:  Diagnosis Date  . Anxiety   . Depression    mild pp depression after 1st pregnancy  . Gastritis   . GERD (gastroesophageal reflux disease)   . UTI (lower urinary tract infection)     MEDICATIONS AT HOME: Prior to Admission medications   Medication Sig Start Date End Date Taking? Authorizing Provider  fluconazole (DIFLUCAN) 150 MG tablet Take 1 tablet (150 mg total) by mouth once for 1 dose. 04/22/20 04/22/20  Anders Simmonds, PA-C  nitrofurantoin, macrocrystal-monohydrate, (MACROBID) 100 MG capsule Take 1 capsule (100 mg total) by mouth 2 (two) times daily. 04/22/20   Anders Simmonds, PA-C  omeprazole  (PRILOSEC) 20 MG capsule Take 1 capsule (20 mg total) by mouth daily. 04/22/20   Anders Simmonds, PA-C  ondansetron (ZOFRAN-ODT) 8 MG disintegrating tablet Take 1 tablet (8 mg total) by mouth every 8 (eight) hours as needed for nausea or vomiting. 02/18/19   Wallis Bamberg, PA-C  paragard intrauterine copper IUD IUD 1 each by Intrauterine route once.    [provider]     Objective:  EXAM:   Vitals:   04/22/20 0940  BP: 112/68  Pulse: (!) 58  Temp: 97.7 F (36.5 C)  TempSrc: Temporal  SpO2: 99%  Weight: 198 lb (89.8 kg)  Height: 5\' 3"  (1.6 m)    General appearance : A&OX3. NAD. Non-toxic-appearing HEENT: Atraumatic and Normocephalic.  PERRLA. EOM intact.   Chest/Lungs:  Breathing-non-labored, Good air entry bilaterally, breath sounds normal without rales, rhonchi, or wheezing  CVS: S1 S2 regular, no murmurs, gallops, rubs  Abdomen: Bowel sounds present, Non tender and not distended with no gaurding, rigidity or rebound. Extremities: Bilateral Lower Ext shows minimal edema, both legs are warm to touch with = pulse throughout.  Normal S&ROM BLE Neurology:  CN II-XII grossly intact, Non focal.   Psych:  TP linear. J/I WNL. Normal speech. Appropriate eye contact and affect.  Skin:  No Rash  Data Review Lab Results  Component Value Date   HGBA1C 5.1 03/29/2018     Assessment & Plan   1. Abdominal pain, unspecified abdominal location Non-acute abd pain - POCT URINALYSIS  DIP (CLINITEK) - POCT urine pregnancy - H. pylori breath test - Comprehensive metabolic panel - omeprazole (PRILOSEC) 20 MG capsule; Take 1 capsule (20 mg total) by mouth daily.  Dispense: 30 capsule; Refill: 3  2. Paresthesia No visible abnormality - TSH  3. Edema, unspecified type -likely dependent edema bc resolves overnight or when she is not working - Comprehensive metabolic panel - CBC with Differential/Platelet  4. Screening, lipid - Lipid panel  5. Pyuria Increase water intake -  Urine Culture - nitrofurantoin, macrocrystal-monohydrate, (MACROBID) 100 MG capsule; Take 1 capsule (100 mg total) by mouth 2 (two) times daily.  Dispense: 10 capsule; Refill: 0 - fluconazole (DIFLUCAN) 150 MG tablet; Take 1 tablet (150 mg total) by mouth once for 1 dose.  Dispense: 1 tablet; Refill: 0     Patient have been counseled extensively about nutrition and exercise  Return in about 6 months (around 10/20/2020) for PCP check up.  The patient was given clear instructions to go to ER or return to medical center if symptoms don't improve, worsen or new problems develop. The patient verbalized understanding. The patient was told to call to get lab results if they haven't heard anything in the next week.     Georgian Co, PA-C [facility] [place], [place] [phone]   04/22/2020, 9:54 AMPatient ID: Melanie Newman, female   DOB: [birth year], 34 y.o.   MRN: [id number]

## 2020-04-23 LAB — CBC WITH DIFFERENTIAL/PLATELET
Basophils Absolute: 0 10*3/uL (ref 0.0–0.2)
Basos: 1 %
EOS (ABSOLUTE): 0.2 10*3/uL (ref 0.0–0.4)
Eos: 3 %
Hematocrit: 35 % (ref 34.0–46.6)
Hemoglobin: 11.3 g/dL (ref 11.1–15.9)
Immature Grans (Abs): 0 10*3/uL (ref 0.0–0.1)
Immature Granulocytes: 0 %
Lymphocytes Absolute: 2.4 10*3/uL (ref 0.7–3.1)
Lymphs: 29 %
MCH: 26.4 pg — ABNORMAL LOW (ref 26.6–33.0)
MCHC: 32.3 g/dL (ref 31.5–35.7)
MCV: 82 fL (ref 79–97)
Monocytes Absolute: 0.5 10*3/uL (ref 0.1–0.9)
Monocytes: 7 %
Neutrophils Absolute: 5 10*3/uL (ref 1.4–7.0)
Neutrophils: 60 %
Platelets: 225 10*3/uL (ref 150–450)
RBC: 4.28 x10E6/uL (ref 3.77–5.28)
RDW: 13.5 % (ref 11.7–15.4)
WBC: 8.2 10*3/uL (ref 3.4–10.8)

## 2020-04-23 LAB — TSH: TSH: 1.3 u[IU]/mL (ref 0.450–4.500)

## 2020-04-23 LAB — LIPID PANEL
Chol/HDL Ratio: 2.8 ratio (ref 0.0–4.4)
Cholesterol, Total: 133 mg/dL (ref 100–199)
HDL: 48 mg/dL (ref >39)
LDL Chol Calc (NIH): 69 mg/dL (ref 0–99)
Triglycerides: 82 mg/dL (ref 0–149)
VLDL Cholesterol Cal: 16 mg/dL (ref 5–40)

## 2020-04-23 LAB — COMPREHENSIVE METABOLIC PANEL
ALT: 36 IU/L — ABNORMAL HIGH (ref 0–32)
AST: 24 IU/L (ref 0–40)
Albumin/Globulin Ratio: 1.3 (ref 1.2–2.2)
Albumin: 4.1 g/dL (ref 3.8–4.8)
Alkaline Phosphatase: 59 IU/L (ref 44–121)
BUN/Creatinine Ratio: 18 (ref 9–23)
BUN: 13 mg/dL (ref 6–20)
Bilirubin Total: 0.7 mg/dL (ref 0.0–1.2)
CO2: 22 mmol/L (ref 20–29)
Calcium: 8.9 mg/dL (ref 8.7–10.2)
Chloride: 101 mmol/L (ref 96–106)
Creatinine, Ser: 0.74 mg/dL (ref 0.57–1.00)
GFR calc Af Amer: 123 mL/min/{1.73_m2} (ref >59)
GFR calc non Af Amer: 107 mL/min/{1.73_m2} (ref >59)
Globulin, Total: 3.2 g/dL (ref 1.5–4.5)
Glucose: 89 mg/dL (ref 65–99)
Potassium: 4.2 mmol/L (ref 3.5–5.2)
Sodium: 136 mmol/L (ref 134–144)
Total Protein: 7.3 g/dL (ref 6.0–8.5)

## 2020-04-23 LAB — H. PYLORI BREATH TEST: H pylori Breath Test: NEGATIVE

## 2020-04-24 LAB — URINE CULTURE

## 2020-04-27 ENCOUNTER — Encounter: Payer: Self-pay | Admitting: *Deleted

## 2020-04-27 ENCOUNTER — Ambulatory Visit: Payer: Self-pay

## 2020-05-19 ENCOUNTER — Encounter: Payer: Self-pay | Admitting: Physician Assistant

## 2020-05-19 ENCOUNTER — Ambulatory Visit: Payer: Self-pay | Attending: Nurse Practitioner | Admitting: Physician Assistant

## 2020-05-19 ENCOUNTER — Other Ambulatory Visit: Payer: Self-pay

## 2020-05-19 DIAGNOSIS — Z789 Other specified health status: Secondary | ICD-10-CM

## 2020-05-19 DIAGNOSIS — R109 Unspecified abdominal pain: Secondary | ICD-10-CM

## 2020-05-19 NOTE — Progress Notes (Signed)
Virtual Visit via Telephone Note  I connected with Melanie Newman on 05/19/20 at  8:50 AM EDT by telephone and verified that I am speaking with the correct person using two identifiers.   I discussed the limitations, risks, security and privacy concerns of performing an evaluation and management service by telephone and the availability of in person appointments. I also discussed with the patient that there may be a patient responsible charge related to this service. The patient expressed understanding and agreed to proceed.   PATIENT visit by telephone virtually in the context of Covid-19 pandemic. Patient location: home My Location:  [facility] office Persons on the call:  Me, the patient, and interpreter;  Roomed by Carolynne Edouard  History of Present Illness: She is continuing to have "pain in the pit of her stomach."  See last OV 04/22/2020.  She has had this pain for more than 3 years.  No blood in stool.  No changes in stool.  Pain is worse after meals.  No vomiting but she does c/o nausea. Omeprazole didn't help.  No weight loss.  No fever.  No urinary s/sx.  LMP 2 weeks ago and normal.  Has IUD for contraception.     Observations/Objective:  NAD.  A&Ox3  Assessment and Plan: 1. Stomach pain Not new;  No red flags fro acute abdomen.  To ED if worsens, becomes severe.  Patient verbalizes understanding. - Ambulatory referral to Gastroenterology  2.language barrier- pacific interpreters used and additional time performing visit was required.  Follow Up Instructions: See PCP prn   I discussed the assessment and treatment plan with the patient. The patient was provided an opportunity to ask questions and all were answered. The patient agreed with the plan and demonstrated an understanding of the instructions.   The patient was advised to call back or seek an in-person evaluation if the symptoms worsen or if the condition fails to improve as anticipated.  I provided 17 minutes  of non-face-to-face time during this encounter.   Georgian Co, PA-C  Patient ID: Melanie Newman, female   DOB: August 22, 1985, 34 y.o.   MRN: [id number]

## 2020-05-28 ENCOUNTER — Encounter: Payer: Self-pay | Admitting: Physician Assistant

## 2020-06-14 ENCOUNTER — Other Ambulatory Visit (INDEPENDENT_AMBULATORY_CARE_PROVIDER_SITE_OTHER): Payer: Self-pay

## 2020-06-14 ENCOUNTER — Encounter: Payer: Self-pay | Admitting: Physician Assistant

## 2020-06-14 ENCOUNTER — Ambulatory Visit (INDEPENDENT_AMBULATORY_CARE_PROVIDER_SITE_OTHER): Payer: Self-pay | Admitting: Physician Assistant

## 2020-06-14 VITALS — BP 110/70 | HR 84 | Ht 63.0 in | Wt 196.0 lb

## 2020-06-14 DIAGNOSIS — G8929 Other chronic pain: Secondary | ICD-10-CM

## 2020-06-14 DIAGNOSIS — Z9049 Acquired absence of other specified parts of digestive tract: Secondary | ICD-10-CM | POA: Insufficient documentation

## 2020-06-14 DIAGNOSIS — R1013 Epigastric pain: Secondary | ICD-10-CM

## 2020-06-14 DIAGNOSIS — R7989 Other specified abnormal findings of blood chemistry: Secondary | ICD-10-CM

## 2020-06-14 LAB — HEPATITIS C ANTIBODY
Hepatitis C Ab: NONREACTIVE
SIGNAL TO CUT-OFF: 0.03 (ref <1.00)

## 2020-06-14 LAB — HEPATITIS B SURFACE ANTIBODY,QUALITATIVE: Hep B S Ab: NONREACTIVE

## 2020-06-14 LAB — HEPATITIS B SURFACE ANTIGEN: Hepatitis B Surface Ag: NONREACTIVE

## 2020-06-14 LAB — SEDIMENTATION RATE: Sed Rate: 14 mm/hr (ref 0–20)

## 2020-06-14 MED ORDER — SUCRALFATE 1 G PO TABS: ORAL_TABLET | ORAL | 1 refills | Status: DC

## 2020-06-14 NOTE — Progress Notes (Signed)
Subjective:    Patient ID: Melanie Newman, female    DOB: 06/03/1986, 34 y.o.   MRN: [id number]  HPI Melanie Newman is a 34 year old non-English-speaking Hispanic female, new to GI today referred by Georgian Co PA-C for evaluation of abdominal pain.  Interview was conducted with interpreter present. She has not had any prior GI evaluation though is status post cholecystectomy in 2008 which was done in [place]. Patient relates her current symptoms have been present over the past 4 years.  Apparently symptoms had been intermittent previously and now over the past several months have become more constant and are present on a daily basis.  She does have associated nausea but no vomiting.  Pain is located in the epigastrium, patient had a difficult time describing the pain and which was interpreted as "falling apart" inside.  Symptoms somewhat worse postprandially.  Appetite has been slightly decreased but weight has been stable.  She says she has been eating less recently because of the pain.  No fever or chills.  She does have loose stools postprandially off and on.  Sometimes feels that a bowel movement will improve her symptoms. She has been on omeprazole for several months without any benefit. Labs done in September 2021 with normal CBC, lipids TSH and H. pylori breath test negative. ALT slightly elevated at 36, 2 years ago ALT 77. Patient has not been using any regular alcohol, no regular aspirin or NSAIDs.  No family history of GI disease that she is aware of.  Review of Systems Pertinent positive and negative review of systems were noted in the above HPI section.  All other review of systems was otherwise negative.  Outpatient Encounter Medications as of 06/14/2020  Medication Sig  . paragard intrauterine copper IUD IUD 1 each by Intrauterine route once.  Marland Kitchen omeprazole (PRILOSEC) 20 MG capsule Take 1 capsule (20 mg total) by mouth daily. (Patient not taking: Reported on 06/14/2020)  .  ondansetron (ZOFRAN-ODT) 8 MG disintegrating tablet Take 1 tablet (8 mg total) by mouth every 8 (eight) hours as needed for nausea or vomiting. (Patient not taking: Reported on 06/14/2020)  . sucralfate (CARAFATE) 1 g tablet Take 1 tablet between meals and at bed time   No facility-administered encounter medications on file as of 06/14/2020.   No Known Allergies Patient Active Problem List   Diagnosis Date Noted  . S/P laparoscopic cholecystectomy 06/14/2020  . Labor and delivery indication for care or intervention 09/06/2014  . [redacted] weeks gestation of pregnancy   . Evaluate fetal position using ultrasound    Social History   Socioeconomic History  . Marital status: Single    Spouse name: Not on file  . Number of children: Not on file  . Years of education: Not on file  . Highest education level: Not on file  Occupational History  . Not on file  Tobacco Use  . Smoking status: Never Smoker  . Smokeless tobacco: Never Used  Vaping Use  . Vaping Use: Never used  Substance and Sexual Activity  . Alcohol use: Yes    Comment: social  . Drug use: No  . Sexual activity: Not Currently    Birth control/protection: I.U.D.  Other Topics Concern  . Not on file  Social History Narrative   ** Merged History Encounter **       Social Determinants of Health   Financial Resource Strain:   . Difficulty of Paying Living Expenses: Not on file  Food Insecurity:   . Worried  About Running Out of Food in the Last Year: Not on file  . Ran Out of Food in the Last Year: Not on file  Transportation Needs:   . Lack of Transportation (Medical): Not on file  . Lack of Transportation (Non-Medical): Not on file  Physical Activity:   . Days of Exercise per Week: Not on file  . Minutes of Exercise per Session: Not on file  Stress:   . Feeling of Stress : Not on file  Social Connections:   . Frequency of Communication with Friends and Family: Not on file  . Frequency of Social Gatherings with Friends  and Family: Not on file  . Attends Religious Services: Not on file  . Active Member of Clubs or Organizations: Not on file  . Attends Banker Meetings: Not on file  . Marital Status: Not on file  Intimate Partner Violence:   . Fear of Current or Ex-Partner: Not on file  . Emotionally Abused: Not on file  . Physically Abused: Not on file  . Sexually Abused: Not on file    Ms. Argueta-Castro's family history includes Jaundice in her nephew.      Objective:    Vitals:   06/14/20 0924  BP: 110/70  Pulse: 84  SpO2: 98%    Physical Exam Well-developed well-nourished Hispanic female in no acute distress.  Height, Weight, 196 BMI 34.7  HEENT; nontraumatic normocephalic, EOMI, PER R LA, sclera anicteric. Oropharynx; not examined Neck; supple, no JVD Cardiovascular; regular rate and rhythm with S1-S2, no murmur rub or gallop Pulmonary; Clear bilaterally Abdomen; soft, she is tender in the epigastrium no guarding or rebound nondistended, no palpable mass or hepatosplenomegaly, bowel sounds are active, lap cholecystectomy scars Rectal; not done today Skin; benign exam, no jaundice rash or appreciable lesions Extremities; no clubbing cyanosis or edema skin warm and dry Neuro/Psych; alert and oriented x4, grossly nonfocal mood and affect appropriate       Assessment & Plan:   #74 34 year old non-English-speaking Hispanic female with 4-year history of epigastric pain unresponsive to PPI.  Recent H. pylori breath test negative.  She is status post cholecystectomy 2008.  Etiology of pain is not clear, rule out chronic gastropathy, peptic ulcer disease, functional nonulcer dyspepsia. 2.  Status post cholecystectomy 2008 3.  Intermittent loose stool postprandially 4.  Elevated ALT  Plan; continue omeprazole 20 mg p.o. every morning Add trial of Carafate 1 g between meals and at bedtime, patient may mix into a slurry with 5 to 10 cc of water. Patient will be scheduled for  upper endoscopy with Dr. Lavon Paganini.  Procedure was discussed in detail with patient including indications risks and benefits and she is agreeable to proceed. Patient has completed COVID-19 vaccination We also discussed upper abdominal ultrasound, if EGD unremarkable proceed to ultrasound versus CT. Check sed rate, hep C antibody, hep B surface antigen and surface antibody  Sammuel Cooper PA-C 06/14/2020   Cc: Claiborne Rigg, NP

## 2020-06-14 NOTE — Patient Instructions (Addendum)
Si tiene 65 aos o ms, su ndice de [employer] corporal debe estar entre 23-30. Su ndice de masa corporal es de 34,72 kg / m. Si esto est fuera del rango mencionado anteriormente, considere hacer un seguimiento con su Proveedor de Marine scientist.  Si tiene 64 aos o menos, su ndice de [employer] corporal debe estar entre 19-25. Su ndice de masa corporal es de 34,72 kg / m. Si est fuera del rango mencionado, considere hacer un seguimiento con su Proveedor de Marine scientist.  Su proveedor ha [employer] que vaya al nivel del stano para realizar anlisis de laboratorio antes de salir hoy. Presione "B" en el ascensor. El laboratorio est ubicado en la primera puerta a la izquierda al salir del Medical sales representative.  Se le ha programado una endoscopia. Siga las instrucciones escritas que se le dieron en su visita de hoy. Si [place] inhaladores (aunque solo sea necesario), trigalos el da de su procedimiento.  INICIO Sucrafato 1 gramo tomar 1 tableta por va oral entre las comidas y antes de [place]. Deber tomar su tableta y disolverla en agua para hacer una suspensin.  Seguimiento pendiente de [employer] de sus laboratorios endoscopia.

## 2020-06-18 ENCOUNTER — Encounter: Payer: Self-pay | Admitting: Gastroenterology

## 2020-06-18 ENCOUNTER — Other Ambulatory Visit: Payer: Self-pay

## 2020-06-18 ENCOUNTER — Ambulatory Visit (AMBULATORY_SURGERY_CENTER): Payer: Self-pay | Admitting: Gastroenterology

## 2020-06-18 VITALS — BP 102/63 | HR 61 | Temp 97.3°F | Resp 13 | Ht 63.0 in | Wt 196.0 lb

## 2020-06-18 DIAGNOSIS — R1013 Epigastric pain: Secondary | ICD-10-CM

## 2020-06-18 DIAGNOSIS — K259 Gastric ulcer, unspecified as acute or chronic, without hemorrhage or perforation: Secondary | ICD-10-CM

## 2020-06-18 DIAGNOSIS — K3189 Other diseases of stomach and duodenum: Secondary | ICD-10-CM

## 2020-06-18 DIAGNOSIS — K2970 Gastritis, unspecified, without bleeding: Secondary | ICD-10-CM

## 2020-06-18 MED ORDER — SODIUM CHLORIDE 0.9 % IV SOLN: 500.0000 mL | Freq: Once | INTRAVENOUS | Status: DC

## 2020-06-18 MED ORDER — OMEPRAZOLE 40 MG PO CPDR: 40.0000 mg | DELAYED_RELEASE_CAPSULE | Freq: Every day | ORAL | 3 refills | Status: DC

## 2020-06-18 NOTE — Progress Notes (Signed)
pt tolerated well. VSS. awake and to recovery. Report given to RN. Bite block inserted and removed without trauma. 

## 2020-06-18 NOTE — Progress Notes (Signed)
VS taken by C.W. 

## 2020-06-18 NOTE — Patient Instructions (Signed)
YOU HAD AN ENDOSCOPIC PROCEDURE TODAY AT THE [facility]:   Refer to the procedure report that was given to you for any specific questions about what was found during the examination.  If the procedure report does not answer your questions, please call your gastroenterologist to clarify.  If you requested that your care partner not be given the details of your procedure findings, then the procedure report has been included in a sealed envelope for you to review at your convenience later.  YOU SHOULD EXPECT: Some feelings of bloating in the abdomen. Passage of more gas than usual.  Walking can help get rid of the air that was put into your GI tract during the procedure and reduce the bloating. If you had a lower endoscopy (such as a colonoscopy or flexible sigmoidoscopy) you may notice spotting of blood in your stool or on the toilet paper. If you underwent a bowel prep for your procedure, you may not have a normal bowel movement for a few days.  Please Note:  You might notice some irritation and congestion in your nose or some drainage.  This is from the oxygen used during your procedure.  There is no need for concern and it should clear up in a day or so.  SYMPTOMS TO REPORT IMMEDIATELY:    Following upper endoscopy (EGD)  Vomiting of blood or coffee ground material  New chest pain or pain under the shoulder blades  Painful or persistently difficult swallowing  New shortness of breath  Fever of 100F or higher  Black, tarry-looking stools  For urgent or emergent issues, a gastroenterologist can be reached at any hour by calling [phone]. Do not use MyChart messaging for urgent concerns.    DIET:  We do recommend a small meal at first, but then you may proceed to your regular diet.  Drink plenty of fluids but you should avoid alcoholic beverages for 24 hours.  ACTIVITY:  You should plan to take it easy for the rest of today and you should NOT DRIVE or use heavy machinery  until tomorrow (because of the sedation medicines used during the test).    FOLLOW UP: Our staff will call the number listed on your records 48-72 hours following your procedure to check on you and address any questions or concerns that you may have regarding the information given to you following your procedure. If we do not reach you, we will leave a message.  We will attempt to reach you two times.  During this call, we will ask if you have developed any symptoms of COVID 19. If you develop any symptoms (ie: fever, flu-like symptoms, shortness of breath, cough etc.) before then, please call [phone].  If you test positive for Covid 19 in the 2 weeks post procedure, please call and report this information to us.    If any biopsies were taken you will be contacted by phone or by letter within the next 1-3 weeks.  Please call us at [phone] if you have not heard about the biopsies in 3 weeks.    SIGNATURES/CONFIDENTIALITY: You and/or your care partner have signed paperwork which will be entered into your electronic medical record.  These signatures attest to the fact that that the information above on your After Visit Summary has been reviewed and is understood.  Full responsibility of the confidentiality of this discharge information lies with you and/or your care-partner. 

## 2020-06-18 NOTE — Op Note (Signed)
[facility] Patient Name: Melanie Newman Procedure Date: 06/18/2020 8:03 AM MRN: [id number] Endoscopist: Napoleon Form , MD Age: 34 Referring MD:  Date of Birth: [birth year] Gender: Female Account #: [id number] Procedure:                Upper GI endoscopy Indications:              Epigastric abdominal pain Medicines:                Monitored Anesthesia Care Procedure:                Pre-Anesthesia Assessment:                           - Prior to the procedure, a History and Physical                            was performed, and patient medications and                            allergies were reviewed. The patient's tolerance of                            previous anesthesia was also reviewed. The risks                            and benefits of the procedure and the sedation                            options and risks were discussed with the patient.                            All questions were answered, and informed consent                            was obtained. Prior Anticoagulants: The patient has                            taken no previous anticoagulant or antiplatelet                            agents. ASA Grade Assessment: II - A patient with                            mild systemic disease. After reviewing the risks                            and benefits, the patient was deemed in                            satisfactory condition to undergo the procedure.                           After obtaining informed consent, the endoscope was  passed under direct vision. Throughout the                            procedure, the patient's blood pressure, pulse, and                            oxygen saturations were monitored continuously. The                            Endoscope was introduced through the mouth, and                            advanced to the second part of duodenum. The upper                            GI endoscopy was  accomplished without difficulty.                            The patient tolerated the procedure well. Scope In: Scope Out: Findings:                 The examined esophagus was normal.                           The Z-line was regular and was found 37 cm from the                            incisors.                           The gastroesophageal flap valve was visualized                            endoscopically and classified as Hill Grade I                            (prominent fold, tight to endoscope).                           Patchy mild inflammation characterized by                            congestion (edema), erosions and erythema was found                            in the gastric antrum and in the prepyloric region                            of the stomach. Biopsies were taken with a cold                            forceps for Helicobacter pylori testing.                           Few non-bleeding superficial gastric ulcers with  a                            clean ulcer base (Forrest Class III) were found in                            the gastric antrum. The largest lesion was 5 mm in                            largest dimension. Biopsies were taken with a cold                            forceps for histology.                           The examined duodenum was normal. Complications:            No immediate complications. Estimated Blood Loss:     Estimated blood loss was minimal. Impression:               - Normal esophagus.                           - Z-line regular, 37 cm from the incisors.                           - Gastroesophageal flap valve classified as Hill                            Grade I (prominent fold, tight to endoscope).                           - Gastritis. Biopsied.                           - Non-bleeding gastric ulcers with a clean ulcer                            base (Forrest Class III). Biopsied.                           - Normal examined  duodenum. Recommendation:           - Resume previous diet.                           - Continue present medications.                           - Await pathology results.                           - Use Prilosec (omeprazole) 40 mg PO daily for 3                            months. Napoleon Form, MD 06/18/2020 8:19:42 AM This report has been signed electronically.

## 2020-06-18 NOTE — Progress Notes (Signed)
Lidocaine 2% 38ml IV gicen as per Dr. Lavon Paganini.

## 2020-06-22 ENCOUNTER — Telehealth: Payer: Self-pay

## 2020-06-22 NOTE — Telephone Encounter (Signed)
  Follow up Call-  Call back number 06/18/2020  Post procedure Call Back phone  # [phone]  Permission to leave phone message Yes  Some recent data might be hidden     Patient questions:  Do you have a fever, pain , or abdominal swelling? No. Pain Score  0 *  Have you tolerated food without any problems? Yes.    Have you been able to return to your normal activities? Yes.    Do you have any questions about your discharge instructions: Diet   No. Medications  No. Follow up visit  No.  Do you have questions or concerns about your Care? No.  Actions: * If pain score is 4 or above: No action needed, pain <4.  1. Have you developed a fever since your procedure? no  2.   Have you had an respiratory symptoms (SOB or cough) since your procedure? no  3.   Have you tested positive for COVID 19 since your procedure no  4.   Have you had any family members/close contacts diagnosed with the COVID 19 since your procedure?  no   If yes to any of these questions please route to Laverna Peace, RN and Karlton Lemon, RN

## 2020-06-24 ENCOUNTER — Encounter: Payer: Self-pay | Admitting: Gastroenterology

## 2020-07-13 NOTE — Progress Notes (Signed)
Reviewed and agree with documentation and assessment and plan. K. Veena Sacred Roa , MD   

## 2020-08-18 ENCOUNTER — Telehealth: Payer: Self-pay | Admitting: Nurse Practitioner

## 2020-08-18 NOTE — Telephone Encounter (Signed)
Pt is having stomach pain and has been taking Omeprazole but  has been making pt feel nauseous  And has not improved. Pt is inquiring for an alternate medication prescribed.   Please advise and thank you

## 2020-08-22 NOTE — Telephone Encounter (Signed)
She should be taking carafate as well and make sure the omeprazole if 40mg . If these are not working. She would need to follow up with GI.

## 2020-08-25 NOTE — Telephone Encounter (Signed)
Attempt to reach patient. No answer and unable to LVM.  

## 2020-09-14 ENCOUNTER — Ambulatory Visit: Payer: Self-pay | Admitting: Nurse Practitioner

## 2020-09-15 ENCOUNTER — Other Ambulatory Visit: Payer: Self-pay

## 2020-09-15 ENCOUNTER — Ambulatory Visit (INDEPENDENT_AMBULATORY_CARE_PROVIDER_SITE_OTHER): Payer: Self-pay | Admitting: Physician Assistant

## 2020-09-15 ENCOUNTER — Encounter: Payer: Self-pay | Admitting: Physician Assistant

## 2020-09-15 ENCOUNTER — Other Ambulatory Visit: Payer: Self-pay | Admitting: Physician Assistant

## 2020-09-15 VITALS — BP 111/74 | HR 74 | Temp 97.3°F | Resp 18 | Ht 63.0 in | Wt 183.0 lb

## 2020-09-15 DIAGNOSIS — R202 Paresthesia of skin: Secondary | ICD-10-CM

## 2020-09-15 DIAGNOSIS — M5442 Lumbago with sciatica, left side: Secondary | ICD-10-CM

## 2020-09-15 DIAGNOSIS — N3000 Acute cystitis without hematuria: Secondary | ICD-10-CM

## 2020-09-15 LAB — POCT URINALYSIS DIP (CLINITEK)
Bilirubin, UA: NEGATIVE
Glucose, UA: NEGATIVE mg/dL
Ketones, POC UA: NEGATIVE mg/dL
Nitrite, UA: NEGATIVE
POC PROTEIN,UA: NEGATIVE
Spec Grav, UA: 1.025 (ref 1.010–1.025)
Urobilinogen, UA: 0.2 E.U./dL
pH, UA: 6.5 (ref 5.0–8.0)

## 2020-09-15 MED ORDER — NITROFURANTOIN MONOHYD MACRO 100 MG PO CAPS: 100.0000 mg | ORAL_CAPSULE | Freq: Two times a day (BID) | ORAL | 0 refills | Status: AC

## 2020-09-15 MED FILL — NITROFURANTOIN MONO-MCR 100: 100 | 7 days supply | Qty: 14 | Fill #0

## 2020-09-15 NOTE — Patient Instructions (Signed)
Your urine did show signs of a urinary tract infection, you will take an antibiotic to help resolve this.  I encourage you to increase your water intake, and get plenty of rest.  My hope is that this should resolve your back pain as well  We will call you with your lab results and discuss if there is anything else we need to do regarding your foot tingling  I hope that you feel better soon, please let us know there is anything else we can do for you  Roney Jaffe, PA-C Physician Assistant [facility] [email or web address]    Infeccin urinaria en los adultos Urinary Tract Infection, Adult  Una infeccin urinaria (IU) puede ocurrir en Corporate treasurer de las vas urinarias. Las vas urinarias incluyen a los riones, los urteres, la vejiga y Engineer, mining. Estos rganos fabrican, Barrister's clerk y eliminan la orina del organismo. La IU alta afecta los urteres y los riones. La IU baja afecta la vejiga y Engineer, mining. Cules son las causas? La mayora de las infecciones de las vas urinarias es causada por la presencia de bacterias en la zona genital, alrededor de la uretra, por donde sale la orina del cuerpo. Estas bacterias proliferan y causan inflamacin en las vas urinarias. Qu incrementa el riesgo? Es ms probable que sufra esta afeccin si:  Tiene colocado un catter [employer].  No puede controlar cundo Automotive engineer (incontinencia).  Es mujer y usted: ? [place] espermicida o diafragma como mtodo anticonceptivo. ? Tiene niveles bajos de estrgenos. ? Est embarazada.  Tiene ciertos genes que [employer].  Es sexualmente activa.  Toma antibiticos.  Tiene una afeccin que causa que el flujo de orina sea [place], como: ? Prstata agrandada, si usted es hombre. ? Obstruccin de Engineer, mining. ? Clculo renal. ? Una afeccin nerviosa que afecta el control de la vejiga (vejiga neurgena). ? No bebe lo suficiente o  no orina con frecuencia.  Tiene ciertas enfermedades crnicas, como: ? Diabetes. ? Un sistema que combate las enfermedades (sistemainmunitario) debilitado. ? Anemia drepanoctica. ? Gota. ? Lesin en la mdula espinal. Cules son los signos o sntomas? Los sntomas de esta afeccin incluyen:  Necesidad inmediata (urgencia) de Geographical information systems officer.  Miccin frecuente. Esto puede incluir pequeas cantidades de [place] cada vez que [place].  Ardor o dolor al [employer].  Presencia de [employer].  Orina con mal olor u [employer] atpico.  Dificultad para Geographical information systems officer.  Mason Jim turbia.  Secrecin vaginal, si es mujer.  Dolor en el abdomen o en la parte inferior de la espalda. Es posible que tambin tenga:  Vmitos o disminucin del apetito.  Confusin.  Irritabilidad o cansancio.  Fiebre o escalofros.  Diarrea. El Engineer, maintenance sntoma en los adultos mayores puede ser la confusin. En algunos casos, es posible que no tengan sntomas hasta que la infeccin empeore. Cmo se diagnostica? Esta afeccin se diagnostica en funcin de sus antecedentes mdicos y de un examen fsico. Tambin pueden hacerle otras pruebas, incluidas las siguientes:  Anlisis de [place].  Anlisis de [place].  Pruebas de infecciones de transmisin sexual (ITS). Si ha tenido ms de una infeccin urinaria (IU), se pueden hacer estudios de diagnstico por imgenes o una cistoscopia para determinar la causa de las infecciones. Cmo se trata? El tratamiento de esta afeccin incluye lo siguiente:  Antibiticos.  Medicamentos de venta libre para [employer].  Beber una cantidad suficiente agua para mantenerse hidratado. Si tiene infecciones con frecuencia o tiene otras afecciones, como un  clculo renal, es posible que deba ver a un mdico especialista en las vas urinarias (urlogo). En casos poco frecuentes, las infecciones urinarias pueden provocar sepsis. La sepsis es una afeccin potencialmente mortal que se produce  cuando el cuerpo responde a una infeccin. La sepsis se trata en el hospital con antibiticos, lquidos y otros medicamentos que se administran por va intravenosa. Siga estas instrucciones en su casa: Medicamentos  Use los medicamentos de venta libre y los recetados solamente como se lo haya indicado el mdico.  Si le recetaron un antibitico, tmelo como se lo haya indicado el mdico. No deje de usar el antibitico aunque comience a Actor. Instrucciones generales  Asegrese de hacer lo siguiente: ? Vaciar la vejiga con frecuencia y en su totalidad. No contener la orina durante largos perodos. ? Vaciar la vejiga despus de [employer]. ? Limpiarse de atrs hacia adelante despus de orinar o defecar, si es mujer. Usar cada trozo de papel higinico solo una vez cuando se limpie.  Beber suficiente lquido como para Pharmacologist la orina de color amarillo plido.  Cumpla con todas las visitas de seguimiento. Esto es importante.   Comunquese con un mdico si:  Los sntomas no mejoran despus de 1 o 2das de tratamiento.  Los sntomas desaparecen y luego vuelven a Research officer, trade union. Solicite ayuda de inmediato si:  Siente dolor intenso en la espalda o en la parte inferior del abdomen.  Tiene fiebre o escalofros.  Tiene nuseas o vmitos. Resumen  Una infeccin urinaria (IU) es una infeccin en cualquier parte de las vas urinarias, que [employer] riones, los urteres, la vejiga y Engineer, mining.  La mayora de las infecciones de las vas urinarias es causada por bacterias en la zona genital.  El tratamiento de esta afeccin suele incluir antibiticos.  Si le recetaron un antibitico, tmelo como se lo haya indicado el mdico. No deje de usar el antibitico aunque comience a Actor.  Cumpla con todas las visitas de seguimiento. Esto es importante. Esta informacin no tiene Theme park manager el consejo del mdico. Asegrese de hacerle al mdico cualquier pregunta que  tenga. Document Revised: 05/17/2020 Document Reviewed: 05/17/2020 Elsevier Patient Education  2021 [employer].

## 2020-09-15 NOTE — Progress Notes (Signed)
Patient has not taken medication today. Patient has eaten today. Patient complains of lower back pain being present for 2 months with an increase in burning sensation over the past 2-3 weeks. Patient also reports tingling sensation in her feet which increases and causes her to lose sleep. Patient reports mid shoulder blade pain as well. Patient reports eating and having stomach pain with bowel movements immediately.

## 2020-09-15 NOTE — Progress Notes (Signed)
Established Patient Office Visit  Subjective:  Patient ID: Melanie Newman, female    DOB: [birth year]  Age: 35 y.o. MRN: [id number]  CC:  Chief Complaint  Patient presents with   Back Pain    HPI Melanie Newman states that she has been having lower left back pain  for "awhile" but worse in the past couple of months, describes it constant and will radiate to her upper back.  Will become worse when under stress.  Denies injury.trauma  Changed beds without relief.  States that she has tried naproxsyn without relief.   Notes history of kidney stones.  Denies hematuria, endorses dysuria in the morning, and approx 3 times a week.  This has been going on for the past two weeks   States that she has been having tingling in her feet to both knees ; states it happens at night, makes it hard to sleep , states that she will have weakness and numbness in her left leg .  Due to language barrier, an interpreter was present during the history-taking and subsequent discussion (and for part of the physical exam) with this patient.     Past Medical History:  Diagnosis Date   Anxiety    Depression    mild pp depression after 1st pregnancy   Gastritis    GERD (gastroesophageal reflux disease)    Heart murmur    maybe in the past but many years ago   UTI (lower urinary tract infection)     Past Surgical History:  Procedure Laterality Date   CHOLECYSTECTOMY     CHOLECYSTECTOMY  2010    Family History  Problem Relation Age of Onset   Jaundice Nephew        unknown reason   Diabetes Neg Hx    Hypertension Neg Hx    Hyperlipidemia Neg Hx    Colon cancer Neg Hx    Stomach cancer Neg Hx    Pancreatic cancer Neg Hx    Esophageal cancer Neg Hx    Rectal cancer Neg Hx     Social History   Socioeconomic History   Marital status: Single    Spouse name: Not on file   Number of children: Not on file   Years of education: Not on file   Highest  education level: Not on file  Occupational History   Not on file  Tobacco Use   Smoking status: Never Smoker   Smokeless tobacco: Never Used  Vaping Use   Vaping Use: Never used  Substance and Sexual Activity   Alcohol use: Yes    Comment: social   Drug use: No   Sexual activity: Not Currently    Birth control/protection: I.U.D.  Other Topics Concern   Not on file  Social History Narrative   ** Merged History Encounter **       Social Determinants of Health   Financial Resource Strain: Not on file  Food Insecurity: Not on file  Transportation Needs: Not on file  Physical Activity: Not on file  Stress: Not on file  Social Connections: Not on file  Intimate Partner Violence: Not on file    Outpatient Medications Prior to Visit  Medication Sig Dispense Refill   omeprazole (PRILOSEC) 40 MG capsule Take 1 capsule (40 mg total) by mouth daily. Take daily for 3 months 90 capsule 3   paragard intrauterine copper IUD IUD 1 each by Intrauterine route once.     sucralfate (CARAFATE) 1 g tablet Take 1  tablet between meals and at bed time 120 tablet 1   ondansetron (ZOFRAN-ODT) 8 MG disintegrating tablet Take 1 tablet (8 mg total) by mouth every 8 (eight) hours as needed for nausea or vomiting. (Patient not taking: No sig reported) 20 tablet 0   omeprazole (PRILOSEC) 20 MG capsule Take 1 capsule (20 mg total) by mouth daily. (Patient not taking: No sig reported) 30 capsule 3   No facility-administered medications prior to visit.    No Known Allergies  ROS Review of Systems  Constitutional: Negative for chills and fever.  HENT: Negative.   Eyes: Negative.   Respiratory: Negative.   Cardiovascular: Negative.   Gastrointestinal: Negative for abdominal pain, diarrhea, nausea and vomiting.  Endocrine: Negative.   Genitourinary: Positive for dysuria. Negative for difficulty urinating, hematuria and vaginal discharge.  Musculoskeletal: Positive for back pain.  Skin:  Negative.   Allergic/Immunologic: Negative.   Neurological: Negative for headaches.  Hematological: Negative.   Psychiatric/Behavioral: Negative.       Objective:    Physical Exam HENT:     Head: Normocephalic and atraumatic.     Right Ear: External ear normal.     Left Ear: External ear normal.     Nose: Nose normal.     Mouth/Throat:     Mouth: Mucous membranes are moist.     Pharynx: Oropharynx is clear.  Eyes:     Conjunctiva/sclera: Conjunctivae normal.     Pupils: Pupils are equal, round, and reactive to light.  Cardiovascular:     Rate and Rhythm: Normal rate and regular rhythm.     Pulses: Normal pulses.          Dorsalis pedis pulses are 2+ on the right side and 2+ on the left side.       Posterior tibial pulses are 2+ on the right side and 2+ on the left side.     Heart sounds: Normal heart sounds.  Pulmonary:     Effort: Pulmonary effort is normal.     Breath sounds: Normal breath sounds.  Abdominal:     General: Abdomen is flat.     Palpations: Abdomen is soft.     Tenderness: There is no abdominal tenderness. There is no right CVA tenderness or left CVA tenderness.  Musculoskeletal:        General: Normal range of motion.  Skin:    General: Skin is warm.  Neurological:     General: No focal deficit present.     Mental Status: She is oriented to person, place, and time.  Psychiatric:        Mood and Affect: Mood normal.        Behavior: Behavior normal.        Thought Content: Thought content normal.        Judgment: Judgment normal.     BP 111/74 (BP Location: Left Arm, Patient Position: Sitting, Cuff Size: Large)    Pulse 74    Temp (!) 97.3 F (36.3 C) (Oral)    Resp 18    Ht 5\' 3"  (1.6 m)    Wt 183 lb (83 kg)    LMP 09/15/2020    SpO2 98%    BMI 32.42 kg/m  Wt Readings from Last 3 Encounters:  09/15/20 183 lb (83 kg)  06/18/20 196 lb (88.9 kg)  06/14/20 196 lb (88.9 kg)     Health Maintenance Due  Topic Date Due   PAP SMEAR-Modifier   Never done   COVID-19 Vaccine (2 -  Pfizer 3-dose series) 04/11/2020    There are no preventive care reminders to display for this patient.  Lab Results  Component Value Date   TSH 1.300 04/22/2020   Lab Results  Component Value Date   WBC 8.2 04/22/2020   HGB 11.3 04/22/2020   HCT 35.0 04/22/2020   MCV 82 04/22/2020   PLT 225 04/22/2020   Lab Results  Component Value Date   NA 137 09/15/2020   K 4.2 09/15/2020   CO2 22 04/22/2020   GLUCOSE 101 (H) 09/15/2020   BUN 15 09/15/2020   CREATININE 0.71 09/15/2020   BILITOT 0.7 09/15/2020   ALKPHOS 63 09/15/2020   AST 17 09/15/2020   ALT 36 (H) 04/22/2020   PROT 7.7 09/15/2020   ALBUMIN 4.4 09/15/2020   CALCIUM 9.0 09/15/2020   ANIONGAP 7 05/13/2016   Lab Results  Component Value Date   CHOL 133 04/22/2020   Lab Results  Component Value Date   HDL 48 04/22/2020   Lab Results  Component Value Date   LDLCALC 69 04/22/2020   Lab Results  Component Value Date   TRIG 82 04/22/2020   Lab Results  Component Value Date   CHOLHDL 2.8 04/22/2020   Lab Results  Component Value Date   HGBA1C 5.1 03/29/2018      Assessment & Plan:   Problem List Items Addressed This Visit   None   Visit Diagnoses    Acute left-sided low back pain with left-sided sciatica    -  Primary   Tingling of both feet       Relevant Orders   Comp. Metabolic Panel (12) (Completed)   Magnesium (Completed)   Acute cystitis without hematuria       Relevant Medications   nitrofurantoin, macrocrystal-monohydrate, (MACROBID) 100 MG capsule   Other Relevant Orders   POCT URINALYSIS DIP (CLINITEK) (Completed)   Urine Culture      Meds ordered this encounter  Medications   nitrofurantoin, macrocrystal-monohydrate, (MACROBID) 100 MG capsule    Sig: Take 1 capsule (100 mg total) by mouth 2 (two) times daily for 7 days.    Dispense:  14 capsule    Refill:  0    Order Specific Question:   Supervising Provider    Answer:   WRIGHT, PATRICK  E [[id number]]   1. Acute left-sided low back pain with left-sided sciatica Possibly related to urinary tract infection.  Trial Macrobid, increase water intake get plenty of rest.  Red flags given for prompt reevaluation  2. Tingling of both feet  - Comp. Metabolic Panel (12) - Magnesium  3. Acute cystitis without hematuria * - POCT URINALYSIS DIP (CLINITEK) - nitrofurantoin, macrocrystal-monohydrate, (MACROBID) 100 MG capsule; Take 1 capsule (100 mg total) by mouth 2 (two) times daily for 7 days.  Dispense: 14 capsule; Refill: 0 - Urine Culture  Follow-up: Return if symptoms worsen or fail to improve.   I have reviewed the patient's medical history (PMH, PSH, Social History, Family History, Medications, and allergies) , and have been updated if relevant. I spent 30 minutes reviewing chart and  face to face time with patient.     Kasandra Knudsen Mayers, PA-C

## 2020-09-16 ENCOUNTER — Encounter: Payer: Self-pay | Admitting: Physician Assistant

## 2020-09-16 LAB — COMP. METABOLIC PANEL (12)
AST: 17 IU/L (ref 0–40)
Albumin/Globulin Ratio: 1.3 (ref 1.2–2.2)
Albumin: 4.4 g/dL (ref 3.8–4.8)
Alkaline Phosphatase: 63 IU/L (ref 44–121)
BUN/Creatinine Ratio: 21 (ref 9–23)
BUN: 15 mg/dL (ref 6–20)
Bilirubin Total: 0.7 mg/dL (ref 0.0–1.2)
Calcium: 9 mg/dL (ref 8.7–10.2)
Chloride: 102 mmol/L (ref 96–106)
Creatinine, Ser: 0.71 mg/dL (ref 0.57–1.00)
GFR calc Af Amer: 129 mL/min/{1.73_m2} (ref >59)
GFR calc non Af Amer: 112 mL/min/{1.73_m2} (ref >59)
Globulin, Total: 3.3 g/dL (ref 1.5–4.5)
Glucose: 101 mg/dL — ABNORMAL HIGH (ref 65–99)
Potassium: 4.2 mmol/L (ref 3.5–5.2)
Sodium: 137 mmol/L (ref 134–144)
Total Protein: 7.7 g/dL (ref 6.0–8.5)

## 2020-09-16 LAB — MAGNESIUM: Magnesium: 1.9 mg/dL (ref 1.6–2.3)

## 2020-09-17 LAB — URINE CULTURE

## 2020-09-29 ENCOUNTER — Telehealth: Payer: Self-pay | Admitting: *Deleted

## 2020-09-29 NOTE — Telephone Encounter (Signed)
Medical Assistant used [employer] to contact patient.  Interpreter Name: Trula Ore Interpreter #: [id number] Patient verified DOB Patient is aware of kidney, liver and magnesium all being normal and no infection noted on the urine culture.

## 2020-09-29 NOTE — Telephone Encounter (Signed)
-----   Message from Roney Jaffe, [place] sent at 09/16/2020  3:39 PM EST ----- Please call patient and let her know that her liver and kidney function were within normal limits.  Her magnesium was also within normal limits.  We are still awaiting the results of her urine culture

## 2020-09-30 ENCOUNTER — Other Ambulatory Visit: Payer: Self-pay | Admitting: Physician Assistant

## 2020-09-30 DIAGNOSIS — R300 Dysuria: Secondary | ICD-10-CM

## 2020-09-30 MED ORDER — FLUCONAZOLE 150 MG PO TABS: 150.0000 mg | ORAL_TABLET | Freq: Once | ORAL | 0 refills | Status: AC

## 2020-09-30 MED FILL — FLUCONAZOLE 150 MG TABLET: 150 | 1 days supply | Qty: 1 | Fill #0

## 2020-09-30 NOTE — Telephone Encounter (Signed)
I sent diflucan to her pharmacy, given her UC did not shows any infection, her dysuria may be from a yeast infection.  She can stop the macrobid at this time

## 2020-09-30 NOTE — Telephone Encounter (Signed)
Medical Assistant used [employer] to contact patient.  Interpreter Name Deniers: Interpreter #: [phone] Patient verified DOB Patient is aware of stopping the macrobid due to no infection being noted on the UC. Patient aware of picking up diflucan to address the dysuria she has been having.

## 2020-10-20 ENCOUNTER — Ambulatory Visit: Payer: Self-pay | Admitting: Nurse Practitioner

## 2020-10-29 MED FILL — FLUCONAZOLE 150 MG TABLET: 150 | 1 days supply | Qty: 1 | Fill #0

## 2021-03-21 ENCOUNTER — Telehealth: Payer: Self-pay | Admitting: Nurse Practitioner

## 2021-03-21 ENCOUNTER — Ambulatory Visit: Payer: Self-pay | Admitting: *Deleted

## 2021-03-21 NOTE — Telephone Encounter (Signed)
I return Pt call, spoke about the [facility] and the [place] program , she was advice she need to schedule an appt with the provider since last time she saw was over 6 month and wee can used that visit for a bill at this time

## 2021-03-21 NOTE — Telephone Encounter (Signed)
Pt called in c/o stomach pain like she had before when she was diagnosed with an ulcer about 6 months to a year ago.   The pain is worse with eating.   The medications she has tried have not helped.   She is also having diarrhea and c/o difficulty sleeping.   She also c/o having burning with urination.   She had an E Coli infection the same time she was diagnosed with the stomach ulcer and was treated for it.   Her symptoms have never really gone away.   She had an endoscopy about 6 months to a year ago and was diagnosed with the ulcer and given omeprazole.   But now the pain has returned even with the medication.  See triage notes.  She is also requesting an [employer] and financial assistance.   I let her know I would send a message to [facility] and Wellness regarding the [facility] and someone would call her back.     There were no appts available with [facility] until Oct 2022.   She has also been seen at Primary Care at [facility].   I encouraged her to call there and see if they had any appts sooner than [facility] which she was agreeable to doing.   I instructed her to go to the urgent care if Primary Care at [facility] did not have any appts.   She verbalized understanding.    I sent my notes and the request for the [employer] to [employer] and Wellness.

## 2021-03-21 NOTE — Telephone Encounter (Signed)
Reason for Disposition  [1] MODERATE pain (e.g., interferes with normal activities) AND [2] pain comes and goes (cramps) AND [3] present > 24 hours  (Exception: pain with Vomiting or Diarrhea - see that Guideline)  Answer Assessment - Initial Assessment Questions 1. LOCATION: "Where does it hurt?"      It's hurts in the middle of my stomach. 2. RADIATION: "Does the pain shoot anywhere else?" (e.g., chest, back)     It's just in the middle.   I'm having nausea with the pain.   Every time I eat the pain gets worse so I haven't been eating much.   No vomiting. 3. ONSET: "When did the pain begin?" (e.g., minutes, hours or days ago)      I went to a clinic and had the endoscopy after going to the ED.   I don't remember when I had the endoscopy maybe a year ago.  I don't remember the dr's name.   I've had this pain before so went to the ED.   They did an endoscopy and gave me medicine.   But now the pain is back.  Stomach ulcer was the diagnosis.    (Pt calling in using Spanish interpreter) They gave me an antibiotic but it made me vomit so she stopped taking it.  I think omeprazole I think is what I was given back then.   Then they changed the dose and it wasn't working.   I have purchased the omeprazole OTC and tried them again but it's not helping.    I also can't sleep.    4. SUDDEN: "Gradual or sudden onset?"     The pain came back suddenly when I eat.   It's worse in the morning.   The pain is mainly when I eat but eating makes it worse. 5. PATTERN "Does the pain come and go, or is it constant?"    - If constant: "Is it getting better, staying the same, or worsening?"      (Note: Constant means the pain never goes away completely; most serious pain is constant and it progresses)     - If intermittent: "How long does it last?" "Do you have pain now?"     (Note: Intermittent means the pain goes away completely between bouts)     Intermittent 6. SEVERITY: "How bad is the pain?"  (e.g., Scale  1-10; mild, moderate, or severe)   - MILD (1-3): doesn't interfere with normal activities, abdomen soft and not tender to touch    - MODERATE (4-7): interferes with normal activities or awakens from sleep, abdomen tender to touch    - SEVERE (8-10): excruciating pain, doubled over, unable to do any normal activities      7 on pain scale. 7. RECURRENT SYMPTOM: "Have you ever had this type of stomach pain before?" If Yes, ask: "When was the last time?" and "What happened that time?"      Yes  See above  8. CAUSE: "What do you think is causing the stomach pain?"     I believe the ulcer is still there.  It was never healed.  9. RELIEVING/AGGRAVATING FACTORS: "What makes it better or worse?" (e.g., movement, antacids, bowel movement)     I haven't taken anything except a powder that I get from my [place].   It has not helped either. 10. OTHER SYMPTOMS: "Do you have any other symptoms?" (e.g., back pain, diarrhea, fever, urination pain, vomiting)       I don't  think the not being able to sleep is related to the stomach pain.  I'm just not sleeping well.  I live alone with 4 kids so I have stress.   I don't really have stress right now that I feel is keeping me awake.     I'm having diarrhea and a burning sensation every time I urinate.  The test showed I had E Coli.   Maybe I have it again.   Around same time as the endoscopy.   Yes I was given a antibiotic for it.   I finished the antibiotic they gave me.   It got better but never went away.   11. PREGNANCY: "Is there any chance you are pregnant?" "When was your last menstrual period?"       No  Protocols used: Abdominal Pain - [facility]

## 2021-04-20 ENCOUNTER — Ambulatory Visit: Payer: Self-pay | Attending: Physician Assistant | Admitting: Physician Assistant

## 2021-04-20 ENCOUNTER — Other Ambulatory Visit: Payer: Self-pay

## 2021-04-20 ENCOUNTER — Encounter: Payer: Self-pay | Admitting: Physician Assistant

## 2021-04-20 VITALS — BP 110/69 | HR 67 | Ht 63.0 in | Wt 202.5 lb

## 2021-04-20 DIAGNOSIS — H6690 Otitis media, unspecified, unspecified ear: Secondary | ICD-10-CM

## 2021-04-20 DIAGNOSIS — Z131 Encounter for screening for diabetes mellitus: Secondary | ICD-10-CM

## 2021-04-20 DIAGNOSIS — G4726 Circadian rhythm sleep disorder, shift work type: Secondary | ICD-10-CM

## 2021-04-20 DIAGNOSIS — R109 Unspecified abdominal pain: Secondary | ICD-10-CM

## 2021-04-20 DIAGNOSIS — Z789 Other specified health status: Secondary | ICD-10-CM

## 2021-04-20 DIAGNOSIS — R1013 Epigastric pain: Secondary | ICD-10-CM

## 2021-04-20 DIAGNOSIS — Z758 Other problems related to medical facilities and other health care: Secondary | ICD-10-CM

## 2021-04-20 DIAGNOSIS — K2970 Gastritis, unspecified, without bleeding: Secondary | ICD-10-CM

## 2021-04-20 MED ORDER — FLUTICASONE PROPIONATE 50 MCG/ACT NA SUSP
2.0000 | Freq: Every day | NASAL | 6 refills | Status: DC
  Filled 2021-04-20: qty 16, 30d supply, fill #0

## 2021-04-20 MED ORDER — OMEPRAZOLE 40 MG PO CPDR
40.0000 mg | DELAYED_RELEASE_CAPSULE | Freq: Every day | ORAL | 3 refills | Status: DC
  Filled 2021-04-20: qty 30, 30d supply, fill #0

## 2021-04-20 MED ORDER — AMOXICILLIN 500 MG PO CAPS
500.0000 mg | ORAL_CAPSULE | Freq: Three times a day (TID) | ORAL | 0 refills | Status: AC
  Filled 2021-04-20: qty 30, 10d supply, fill #0

## 2021-04-20 NOTE — Progress Notes (Signed)
Melanie Newman

## 2021-04-20 NOTE — Progress Notes (Signed)
Melanie Newman, is a 35 y.o. female  WNI:[id number]  FGH:[id number]  DOB - 1985/12/22  Chief Complaint  Patient presents with   Abdominal Pain       Subjective:   Melanie Newman is a 35 y.o. female here today for several issues./  wants to be retested for h. Pylori bc she feels like she is having the same symptoms she had about a year ago.  +dyspepsia after meals.  She has had her gallbladder removed.  Pain is worse after meals and associaled with nausea.  No vomiting.  No changes in BM.  No fever.  No urinary s/sx.  Pain for about 1 months now.  Also c/o B ear pain L>R.  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      Patient has No headache, No chest pain, No abdominal pain - No Nausea, No new weakness tingling or numbness, No Cough - SOB.  No problems updated.  ALLERGIES: No Known Allergies  PAST MEDICAL HISTORY: Past Medical History:  Diagnosis Date   Anxiety    Depression    mild pp depression after 1st pregnancy   Gastritis    GERD (gastroesophageal reflux disease)    Heart murmur    maybe in the past but many years ago   UTI (lower urinary tract infection)     MEDICATIONS AT HOME: Prior to Admission medications   Medication Sig Start Date End Date Taking? Authorizing Provider  amoxicillin (AMOXIL) 500 MG capsule Take 1 capsule (500 mg total) by mouth 3 (three) times daily for 10 days. 04/20/21 04/30/21 Yes Charm Stenner, Marzella Schlein, PA-C  fluticasone (FLONASE) 50 MCG/ACT nasal spray Place 2 sprays into both nostrils daily. 04/20/21  Yes Jeremy Mclamb M, PA-C  omeprazole (PRILOSEC) 40 MG capsule Take 1 capsule (40 mg total) by mouth daily. Take daily for 3 months 04/20/21  Yes Seana Underwood M, PA-C  ondansetron (ZOFRAN-ODT) 8 MG disintegrating tablet Take 1 tablet (8 mg total) by mouth every 8 (eight) hours as needed for nausea or vomiting. 02/18/19  Yes Wallis Bamberg, PA-C  paragard intrauterine copper IUD  IUD 1 each by Intrauterine route once.   Yes [provider]  sucralfate (CARAFATE) 1 g tablet Take 1 tablet between meals and at bed time 06/14/20  Yes Esterwood, Amy S, PA-C    ROS: Neg HEENT Neg resp Neg cardiac Neg GI Neg GU Neg MS Neg psych Neg neuro  Objective:   Vitals:   04/20/21 1515  BP: 110/69  Pulse: 67  SpO2: 98%  Weight: 202 lb 8 oz (91.9 kg)  Height: 5\' 3"  (1.6 m)   Exam General appearance : Awake, alert, not in any distress. Speech Clear. Not toxic looking HEENT: Atraumatic and Normocephalic, pupils equally reactive to light and accomodation.  B TM bulging and L TM is erythematous as well.  Canals B WNL Neck: Supple, no JVD. No cervical lymphadenopathy.  Chest: Good air entry bilaterally, CTAB.  No rales/rhonchi/wheezing CVS: S1 S2 regular, no murmurs.  Abdomen: Bowel sounds present, Non tender and not distended with no gaurding, rigidity or rebound. Extremities: B/L Lower Ext shows no edema, both legs are warm to touch Neurology: Awake alert, and oriented X 3, CN II-XII intact, Non focal Skin: No Rash  Data Review Lab Results  Component Value Date   HGBA1C 5.1 03/29/2018    Assessment & Plan   1. Stomach pain Non-acute abdomen - H.  pylori breath test - Comprehensive metabolic panel  2. Language barrier [employer] "[place]" interpreters used and additional time performing visit was required.   3. Screening for diabetes mellitus I have had a lengthy discussion and provided education about insulin resistance and the intake of too much sugar/refined carbohydrates.  I have advised the patient to work at a goal of eliminating sugary drinks, candy, desserts, sweets, refined sugars, processed foods, and white carbohydrates.  The patient expresses understanding.  - Comprehensive metabolic panel - Hemoglobin A1c  4. Shift work sleep disorder Sleep hygiene reviewed  5. Acute otitis media, unspecified otitis media type - fluticasone (FLONASE) 50  MCG/ACT nasal spray; Place 2 sprays into both nostrils daily.  Dispense: 16 g; Refill: 6 - amoxicillin (AMOXIL) 500 MG capsule; Take 1 capsule (500 mg total) by mouth 3 (three) times daily for 10 days.  Dispense: 30 capsule; Refill: 0  6. Abdominal pain, epigastric Non-acute abdomen - omeprazole (PRILOSEC) 40 MG capsule; Take 1 capsule (40 mg total) by mouth daily. Take daily for 3 months  Dispense: 90 capsule; Refill: 3  7. Gastritis without bleeding, unspecified chronicity, unspecified gastritis type - omeprazole (PRILOSEC) 40 MG capsule; Take 1 capsule (40 mg total) by mouth daily. Take daily for 3 months  Dispense: 90 capsule; Refill: 3    Patient have been counseled extensively about nutrition and exercise. Other issues discussed during this visit include: low cholesterol diet, weight control and daily exercise, foot care, annual eye examinations at Ophthalmology, importance of adherence with medications and regular follow-up. We also discussed long term complications of uncontrolled diabetes and hypertension.   Return in about 6 months (around 10/18/2021) for PCP.  The patient was given clear instructions to go to ER or return to medical center if symptoms don't improve, worsen or new problems develop. The patient verbalized understanding. The patient was told to call to get lab results if they haven't heard anything in the next week.      Georgian Co, PA-C [facility] [place], [place] [phone]   04/20/2021, 3:39 PM

## 2021-04-21 ENCOUNTER — Other Ambulatory Visit: Payer: Self-pay

## 2021-04-21 LAB — COMPREHENSIVE METABOLIC PANEL
ALT: 36 IU/L — ABNORMAL HIGH (ref 0–32)
AST: 26 IU/L (ref 0–40)
Albumin/Globulin Ratio: 1.4 (ref 1.2–2.2)
Albumin: 4.5 g/dL (ref 3.8–4.8)
Alkaline Phosphatase: 65 IU/L (ref 44–121)
BUN/Creatinine Ratio: 21 (ref 9–23)
BUN: 18 mg/dL (ref 6–20)
Bilirubin Total: 0.7 mg/dL (ref 0.0–1.2)
CO2: 23 mmol/L (ref 20–29)
Calcium: 9.1 mg/dL (ref 8.7–10.2)
Chloride: 102 mmol/L (ref 96–106)
Creatinine, Ser: 0.87 mg/dL (ref 0.57–1.00)
Globulin, Total: 3.2 g/dL (ref 1.5–4.5)
Glucose: 97 mg/dL (ref 65–99)
Potassium: 4.3 mmol/L (ref 3.5–5.2)
Sodium: 139 mmol/L (ref 134–144)
Total Protein: 7.7 g/dL (ref 6.0–8.5)
eGFR: 90 mL/min/{1.73_m2} (ref >59)

## 2021-04-21 LAB — H. PYLORI BREATH TEST: H pylori Breath Test: NEGATIVE

## 2021-04-21 LAB — HEMOGLOBIN A1C
Est. average glucose Bld gHb Est-mCnc: 120 mg/dL
Hgb A1c MFr Bld: 5.8 % — ABNORMAL HIGH (ref 4.8–5.6)

## 2021-04-22 ENCOUNTER — Other Ambulatory Visit: Payer: Self-pay

## 2021-05-18 ENCOUNTER — Telehealth: Payer: Self-pay | Admitting: *Deleted

## 2021-05-18 NOTE — Telephone Encounter (Signed)
Copied from CRM [id number]. Topic: General - Other >> May 18, 2021  8:37 AM Jaquita Rector A wrote: Reason for CRM: Patient called in to inform Bertram Denver that she took all her antibiotics but still having lots of ear pain. Also states that she never got the results of test that were taken. Please call Ph# [phone]

## 2021-05-20 ENCOUNTER — Other Ambulatory Visit: Payer: Self-pay

## 2021-05-20 ENCOUNTER — Other Ambulatory Visit: Payer: Self-pay | Admitting: Nurse Practitioner

## 2021-05-20 MED ORDER — OFLOXACIN 0.3 % OT SOLN
5.0000 [drp] | Freq: Every day | OTIC | 0 refills | Status: AC
  Filled 2021-05-20: qty 5, 10d supply, fill #0

## 2021-05-20 NOTE — Telephone Encounter (Signed)
Please follow up

## 2021-05-20 NOTE — Telephone Encounter (Signed)
Please let her know ear drops have been sent to pharmacy

## 2021-05-20 NOTE — Telephone Encounter (Signed)
With the help of Translator Wynona Canes [phone] I informed of the Rx for the ear. She stated she understand.

## 2021-05-27 ENCOUNTER — Other Ambulatory Visit: Payer: Self-pay

## 2021-07-05 ENCOUNTER — Ambulatory Visit: Payer: Self-pay

## 2021-07-05 NOTE — Telephone Encounter (Signed)
Using Spanish interpreter [place] # [phone]. Pt. Reports she has had heart palpitations "for a very long time. I talked to my doctor about this and she said I needed an echocardiogram. But I need my orange card to pay for this." Requesting someone call to her and set this up. Also needs appointment for palpitations. Instructed if this returns to call 911 and go to ED. Verbalizes understanding.     Answer Assessment - Initial Assessment Questions 1. DESCRIPTION: "Please describe your heart rate or heartbeat that you are having" (e.g., fast/slow, regular/irregular, skipped or extra beats, "palpitations")     Palpitations 2. ONSET: "When did it start?" (Minutes, hours or days)      Last week, none now 3. DURATION: "How long does it last" (e.g., seconds, minutes, hours)     Seconds 4. PATTERN "Does it come and go, or has it been constant since it started?"  "Does it get worse with exertion?"   "Are you feeling it now?"     Comes and goes 5. TAP: "Using your hand, can you tap out what you are feeling on a chair or table in front of you, so that I can hear?" (Note: not all patients can do this)       No 6. HEART RATE: "Can you tell me your heart rate?" "How many beats in 15 seconds?"  (Note: not all patients can do this)       No 7. RECURRENT SYMPTOM: "Have you ever had this before?" If Yes, ask: "When was the last time?" and "What happened that time?"      Yes 8. CAUSE: "What do you think is causing the palpitations?"     Unsure 9. CARDIAC HISTORY: "Do you have any history of heart disease?" (e.g., heart attack, angina, bypass surgery, angioplasty, arrhythmia)      No 10. OTHER SYMPTOMS: "Do you have any other symptoms?" (e.g., dizziness, chest pain, sweating, difficulty breathing)       Shortness of breath 11. PREGNANCY: "Is there any chance you are pregnant?" "When was your last menstrual period?"       No  Protocols used: Heart Rate and Heartbeat Questions-A-AH

## 2021-07-07 ENCOUNTER — Telehealth: Payer: Self-pay | Admitting: Nurse Practitioner

## 2021-07-07 NOTE — Telephone Encounter (Signed)
Copied from CRM [id number]. Topic: Appointment Scheduling - Scheduling Inquiry for Clinic >> Jul 05, 2021  8:25 AM Wyonia Hough E wrote: Reason for CRM: Pt would like an appt with Mikle Bosworth for an orange card/ please advise

## 2021-07-07 NOTE — Telephone Encounter (Signed)
I return Pt call, LVM to call me back 

## 2021-07-18 ENCOUNTER — Ambulatory Visit: Payer: Self-pay | Attending: Nurse Practitioner

## 2021-07-18 ENCOUNTER — Other Ambulatory Visit: Payer: Self-pay

## 2021-07-27 ENCOUNTER — Ambulatory Visit: Payer: Self-pay

## 2021-07-28 ENCOUNTER — Ambulatory Visit: Payer: Self-pay | Admitting: Physician Assistant

## 2021-08-16 ENCOUNTER — Emergency Department (HOSPITAL_COMMUNITY)
Admission: EM | Admit: 2021-08-16 | Discharge: 2021-08-16 | Disposition: A | Discharge status: Home / Self Care | Payer: Self-pay | Attending: Emergency Medicine | Admitting: Emergency Medicine

## 2021-08-16 ENCOUNTER — Ambulatory Visit: Payer: Self-pay

## 2021-08-16 ENCOUNTER — Emergency Department (HOSPITAL_COMMUNITY): Payer: Self-pay

## 2021-08-16 ENCOUNTER — Other Ambulatory Visit: Payer: Self-pay

## 2021-08-16 ENCOUNTER — Encounter (HOSPITAL_COMMUNITY): Payer: Self-pay

## 2021-08-16 DIAGNOSIS — R202 Paresthesia of skin: Secondary | ICD-10-CM | POA: Insufficient documentation

## 2021-08-16 DIAGNOSIS — M5416 Radiculopathy, lumbar region: Secondary | ICD-10-CM

## 2021-08-16 DIAGNOSIS — R109 Unspecified abdominal pain: Secondary | ICD-10-CM | POA: Insufficient documentation

## 2021-08-16 DIAGNOSIS — R300 Dysuria: Secondary | ICD-10-CM | POA: Insufficient documentation

## 2021-08-16 DIAGNOSIS — R200 Anesthesia of skin: Secondary | ICD-10-CM | POA: Insufficient documentation

## 2021-08-16 DIAGNOSIS — M545 Low back pain, unspecified: Secondary | ICD-10-CM | POA: Insufficient documentation

## 2021-08-16 LAB — URINALYSIS, ROUTINE W REFLEX MICROSCOPIC
Bacteria, UA: NONE SEEN
Bilirubin Urine: NEGATIVE
Glucose, UA: NEGATIVE mg/dL
Ketones, ur: NEGATIVE mg/dL
Leukocytes,Ua: NEGATIVE
Nitrite: NEGATIVE
Protein, ur: NEGATIVE mg/dL
Specific Gravity, Urine: 1.012 (ref 1.005–1.030)
pH: 7 (ref 5.0–8.0)

## 2021-08-16 LAB — CBC WITH DIFFERENTIAL/PLATELET
Abs Immature Granulocytes: 0.02 10*3/uL (ref 0.00–0.07)
Basophils Absolute: 0.1 10*3/uL (ref 0.0–0.1)
Basophils Relative: 1 %
Eosinophils Absolute: 0.3 10*3/uL (ref 0.0–0.5)
Eosinophils Relative: 3 %
HCT: 41.2 % (ref 36.0–46.0)
Hemoglobin: 13.4 g/dL (ref 12.0–15.0)
Immature Granulocytes: 0 %
Lymphocytes Relative: 35 %
Lymphs Abs: 3.2 10*3/uL (ref 0.7–4.0)
MCH: 27.2 pg (ref 26.0–34.0)
MCHC: 32.5 g/dL (ref 30.0–36.0)
MCV: 83.6 fL (ref 80.0–100.0)
Monocytes Absolute: 0.7 10*3/uL (ref 0.1–1.0)
Monocytes Relative: 8 %
Neutro Abs: 5 10*3/uL (ref 1.7–7.7)
Neutrophils Relative %: 53 %
Platelets: 244 10*3/uL (ref 150–400)
RBC: 4.93 MIL/uL (ref 3.87–5.11)
RDW: 13.8 % (ref 11.5–15.5)
WBC: 9.3 10*3/uL (ref 4.0–10.5)
nRBC: 0 % (ref 0.0–0.2)

## 2021-08-16 LAB — COMPREHENSIVE METABOLIC PANEL
ALT: 51 U/L — ABNORMAL HIGH (ref 0–44)
AST: 32 U/L (ref 15–41)
Albumin: 4.5 g/dL (ref 3.5–5.0)
Alkaline Phosphatase: 55 U/L (ref 38–126)
Anion gap: 8 (ref 5–15)
BUN: 14 mg/dL (ref 6–20)
CO2: 25 mmol/L (ref 22–32)
Calcium: 9.3 mg/dL (ref 8.9–10.3)
Chloride: 101 mmol/L (ref 98–111)
Creatinine, Ser: 0.68 mg/dL (ref 0.44–1.00)
GFR, Estimated: >60 mL/min (ref >60)
Glucose, Bld: 93 mg/dL (ref 70–99)
Potassium: 3.9 mmol/L (ref 3.5–5.1)
Sodium: 134 mmol/L — ABNORMAL LOW (ref 135–145)
Total Bilirubin: 1 mg/dL (ref 0.3–1.2)
Total Protein: 8.6 g/dL — ABNORMAL HIGH (ref 6.5–8.1)

## 2021-08-16 LAB — POC URINE PREG, ED: Preg Test, Ur: NEGATIVE

## 2021-08-16 MED ORDER — PREDNISONE 20 MG PO TABS: ORAL_TABLET | ORAL | 0 refills | Status: DC

## 2021-08-16 MED ORDER — MORPHINE SULFATE (PF) 4 MG/ML IV SOLN
4.0000 mg | Freq: Once | INTRAVENOUS | Status: AC
  Administered 2021-08-16: 4 mg via INTRAVENOUS
  Filled 2021-08-16: qty 1

## 2021-08-16 MED ORDER — CYCLOBENZAPRINE HCL 5 MG PO TABS: 5.0000 mg | ORAL_TABLET | Freq: Three times a day (TID) | ORAL | 0 refills | Status: DC | PRN

## 2021-08-16 MED ORDER — ONDANSETRON HCL 4 MG/2ML IJ SOLN
4.0000 mg | Freq: Once | INTRAMUSCULAR | Status: AC
  Administered 2021-08-16: 4 mg via INTRAVENOUS
  Filled 2021-08-16: qty 2

## 2021-08-16 MED ORDER — KETOROLAC TROMETHAMINE 30 MG/ML IJ SOLN
30.0000 mg | Freq: Once | INTRAMUSCULAR | Status: AC
  Administered 2021-08-16: 30 mg via INTRAVENOUS
  Filled 2021-08-16: qty 1

## 2021-08-16 NOTE — Telephone Encounter (Signed)
Using [employer] [phone].   Chief Complaint: Left lower back pain Symptoms: burning with urination, urgency, frequency Frequency: Urine symptoms started today, back pain ongoing x 8 days (worse today) Pertinent Negatives: Patient denies other symptoms Disposition: [] ED /[] Urgent Care (no appt availability in office) / [] Appointment(In office/virtual)/ []  [place] [facility]/ [] Home Care/ [] Refused Recommended Disposition /[x] [place] [facility]/ []  Follow-up with PCP Additional Notes: Went to the ED a while back and was told I have kidney stones which would resolve on their own. Now the pain is worse and urine symptoms have started. Advised no openings in the office this week, mobile bus location provided, she will go there.   Reason for Disposition  [1] SEVERE back pain (e.g., excruciating, unable to do any normal activities) AND [2] not improved 2 hours after pain medicine  Answer Assessment - Initial Assessment Questions 1. ONSET: "When did the pain begin?"      8 days ago 2. LOCATION: "Where does it hurt?" (upper, mid or lower back)     Lower back on the left side 3. SEVERITY: "How bad is the pain?"  (e.g., Scale 1-10; mild, moderate, or severe)   - MILD (1-3): doesn't interfere with normal activities    - MODERATE (4-7): interferes with normal activities or awakens from sleep    - SEVERE (8-10): excruciating pain, unable to do any normal activities      8 4. PATTERN: "Is the pain constant?" (e.g., yes, no; constant, intermittent)      Constant 5. RADIATION: "Does the pain shoot into your legs or elsewhere?"     To the side of belly button 6. CAUSE:  "What do you think is causing the back pain?"      Hospital said kidney stones that would resolve 7. BACK OVERUSE:  "Any recent lifting of heavy objects, strenuous work or exercise?"     N/A 8. MEDICATIONS: "What have you taken so far for the pain?" (e.g., nothing, acetaminophen, NSAIDS)     N/A 9.  NEUROLOGIC SYMPTOMS: "Do you have any weakness, numbness, or problems with bowel/bladder control?"     N/A 10. OTHER SYMPTOMS: "Do you have any other symptoms?" (e.g., fever, abdominal pain, burning with urination, blood in urine)       Burning with urination, abdominal pain, urgency, frequency 11. PREGNANCY: "Is there any chance you are pregnant?" (e.g., yes, no; LMP)       No  Protocols used: Back Pain-A-AH

## 2021-08-16 NOTE — ED Provider Notes (Signed)
[facility]-EMERGENCY DEPT Provider Note   CSN: [id number] Arrival date & time: 08/16/21  1459     History  Chief Complaint  Patient presents with   Back Pain    Shaquanna Lycan is a 36 y.o. female here presenting with left-sided back pain.  Patient states that she has been having left-sided back pain for the last week or so.  Patient states that since yesterday got worse.  She states that she has some burning with urination.  She also has some numbness and tingling down the left leg.  Patient has history of kidney stones with similar symptoms  The history is provided by the patient.  Spanish interpreter used    Home Medications Prior to Admission medications   Medication Sig Start Date End Date Taking? Authorizing Provider  fluticasone (FLONASE) 50 MCG/ACT nasal spray Place 2 sprays into both nostrils daily. 04/20/21   Anders Simmonds, PA-C  omeprazole (PRILOSEC) 40 MG capsule Take 1 capsule (40 mg total) by mouth daily. Take daily for 3 months 04/20/21   Anders Simmonds, PA-C  ondansetron (ZOFRAN-ODT) 8 MG disintegrating tablet Take 1 tablet (8 mg total) by mouth every 8 (eight) hours as needed for nausea or vomiting. 02/18/19   Wallis Bamberg, PA-C  paragard intrauterine copper IUD IUD 1 each by Intrauterine route once.    [provider]  sucralfate (CARAFATE) 1 g tablet Take 1 tablet between meals and at bed time 06/14/20   Esterwood, Amy S, PA-C      Allergies    Patient has no known allergies.    Review of Systems   Review of Systems  Musculoskeletal:  Positive for back pain.  All other systems reviewed and are negative.  Physical Exam Updated Vital Signs BP 112/83    Pulse 64    Temp 98.7 F (37.1 C)    Resp 18    Ht 5\' 3"  (1.6 m)    Wt 92.5 kg    SpO2 100%    BMI 36.14 kg/m  Physical Exam Vitals and nursing note reviewed.  Constitutional:      Appearance: Normal appearance.     Comments: Slightly uncomfortable  HENT:      Head: Normocephalic.     Nose: Nose normal.     Mouth/Throat:     Mouth: Mucous membranes are moist.  Eyes:     Extraocular Movements: Extraocular movements intact.     Pupils: Pupils are equal, round, and reactive to light.  Cardiovascular:     Rate and Rhythm: Normal rate and regular rhythm.     Pulses: Normal pulses.     Heart sounds: Normal heart sounds.  Pulmonary:     Effort: Pulmonary effort is normal.     Breath sounds: Normal breath sounds.  Abdominal:     General: Abdomen is flat.     Palpations: Abdomen is soft.     Comments: Mild left CVA tenderness versus left paralumbar tenderness.  Musculoskeletal:        General: Normal range of motion.     Cervical back: Normal range of motion and neck supple.     Comments: Patient has negative straight leg raise bilaterally.  Normal reflexes bilaterally.  No saddle anesthesia  Skin:    General: Skin is warm.     Capillary Refill: Capillary refill takes less than 2 seconds.  Neurological:     General: No focal deficit present.     Mental Status: She is alert and  oriented to person, place, and time.  Psychiatric:        Mood and Affect: Mood normal.        Behavior: Behavior normal.    ED Results / Procedures / Treatments   Labs (all labs ordered are listed, but only abnormal results are displayed) Labs Reviewed  URINALYSIS, ROUTINE W REFLEX MICROSCOPIC - Abnormal; Notable for the following components:      Result Value   Color, Urine STRAW (*)    Hgb urine dipstick SMALL (*)    All other components within normal limits  COMPREHENSIVE METABOLIC PANEL - Abnormal; Notable for the following components:   Sodium 134 (*)    Total Protein 8.6 (*)    ALT 51 (*)    All other components within normal limits  CBC WITH DIFFERENTIAL/PLATELET  POC URINE PREG, ED    EKG None  Radiology DG Lumbar Spine Complete  Result Date: 08/16/2021 CLINICAL DATA:  Back pain EXAM: LUMBAR SPINE - COMPLETE 4+ VIEW COMPARISON:  03/14/2008  FINDINGS: No recent fracture is seen. There is no significant disc space narrowing. Alignment of posterior margins of vertebral bodies is unremarkable. Degenerative changes are noted in the facet joints at L5-S1 level. Surgical clips are seen in the right upper quadrant and right lower quadrant. IUD is seen in the pelvis. IMPRESSION: No recent fracture is seen. There is no significant disc space narrowing. Degenerative changes are noted in facet joints in the lower lumbar spine. Electronically Signed   By: Ernie AvenaPalani  Rathinasamy M.D.   On: 08/16/2021 16:18   CT Renal Stone Study  Result Date: 08/16/2021 CLINICAL DATA:  Left flank pain.  History of kidney stones. EXAM: CT ABDOMEN AND PELVIS WITHOUT CONTRAST TECHNIQUE: Multidetector CT imaging of the abdomen and pelvis was performed following the standard protocol without IV contrast. RADIATION DOSE REDUCTION: This exam was performed according to the departmental dose-optimization program which includes automated exposure control, adjustment of the mA and/or kV according to patient size and/or use of iterative reconstruction technique. COMPARISON:  CT dated 05/14/2016. FINDINGS: Evaluation of this exam is limited in the absence of intravenous contrast. Lower chest: The visualized lung bases are clear. No intra-abdominal free air or free fluid. Hepatobiliary: Fatty liver. No intrahepatic biliary dilatation. Cholecystectomy. Pancreas: Unremarkable. No pancreatic ductal dilatation or surrounding inflammatory changes. Spleen: Normal in size without focal abnormality. Adrenals/Urinary Tract: The adrenal glands are unremarkable. The kidneys, visualized ureters, and urinary bladder appear unremarkable. Stomach/Bowel: There is no bowel obstruction or active inflammation. The appendix is normal. Vascular/Lymphatic: The abdominal aorta and IVC are unremarkable on this noncontrast CT. No portal venous gas. There is no adenopathy. Reproductive: The uterus is anteverted. An  intrauterine device is noted which appears in appropriate positioning. No adnexal masses. Other: Small fat containing umbilical hernia. Musculoskeletal: No acute or significant osseous findings. IMPRESSION: 1. No acute intra-abdominal or pelvic pathology. No hydronephrosis or nephrolithiasis. 2. Fatty liver. Electronically Signed   By: Elgie CollardArash  Radparvar M.D.   On: 08/16/2021 20:19    Procedures Procedures    Medications Ordered in ED Medications  ketorolac (TORADOL) 30 MG/ML injection 30 mg (30 mg Intravenous Given 08/16/21 1959)  morphine 4 MG/ML injection 4 mg (4 mg Intravenous Given 08/16/21 2001)  ondansetron (ZOFRAN) injection 4 mg (4 mg Intravenous Given 08/16/21 1957)    ED Course/ Medical Decision Making/ A&P  Medical Decision Making Kamika Goodloe is a 36 y.o. female who presented with left flank pain.  Differential includes radiculopathy versus musculoskeletal versus renal colic.  Plan to get CBC and CMP and CT renal stone and CT lumbar spine and also urinalysis. Will give pain medicine and reassess  10:19 PM CT showed no hydro.  There is possible disc bulging and I think she likely has lumbar radiculopathy.  Will discharge home with a course of steroids and muscle relaxants and spine follow up   Amount and/or Complexity of Data Reviewed External Data Reviewed: labs and radiology. Labs: ordered. Decision-making details documented in ED Course. Radiology: ordered and independent interpretation performed. Decision-making details documented in ED Course.  Final Clinical Impression(s) / ED Diagnoses Final diagnoses:  None    Rx / DC Orders ED Discharge Orders     None         Charlynne Pander, MD 08/16/21 2223

## 2021-08-16 NOTE — ED Triage Notes (Signed)
Patient c/o left back pain x 8 days. Patient reports a history of kidney stones. Patient also c/o burning with urination. Patient also reports that she is having weakness and numbness of the left leg.

## 2021-08-16 NOTE — ED Provider Triage Note (Signed)
Emergency Medicine Provider Triage Evaluation Note  Melanie Newman , a 36 y.o. female  was evaluated in triage.  Pt complains of back pain.  Pain is described as burning, radiating down the left leg.  Worse with movement or sitting for long periods of time.  History of kidney stones.  She complains of urinary frequency and foul odor.  No nausea vomiting or fever.  Review of Systems  Positive: Back pain Negative: Weakness  Physical Exam  BP 126/85 (BP Location: Left Arm)    Pulse 79    Temp 98.7 F (37.1 C)    Resp 16    SpO2 99%  Gen:   Awake, no distress   Resp:  Normal effort  MSK:   Moves extremities without difficulty  Other:  Full lumbar range of motion  Medical Decision Making  Medically screening exam initiated at 3:27 PM.  Appropriate orders placed.  Melanie Newman was informed that the remainder of the evaluation will be completed by another provider, this initial triage assessment does not replace that evaluation, and the importance of remaining in the ED until their evaluation is complete.  Work-up initiated   Melanie Captain, PA-C 08/16/21 1529

## 2021-08-16 NOTE — ED Notes (Signed)
Pt A&OX4 ambulatory at d/c with independent steady gait, NAD. Pt verbalized understanding of d/c instructions, prescriptions and follow up care. 

## 2021-08-16 NOTE — Discharge Instructions (Addendum)
Take motrin for pain   Take flexeril for muscle spasms   Take prednisone as prescribed   Follow-up with neurosurgeon  Return to ER if you have worse back pain, numbness, weakness

## 2021-08-17 ENCOUNTER — Ambulatory Visit: Payer: Self-pay

## 2021-08-24 NOTE — Progress Notes (Deleted)
Patient ID: Melanie Newman, female   DOB: 08-10-1985, 36 y.o.   MRN: [id number]   After ED visit 08/16/2021 for back pain  From ED note: Melanie Newman is a 36 y.o. female who presented with left flank pain.  Differential includes radiculopathy versus musculoskeletal versus renal colic.  Plan to get CBC and CMP and CT renal stone and CT lumbar spine and also urinalysis. Will give pain medicine and reassess   10:19 PM CT showed no hydro.  There is possible disc bulging and I think she likely has lumbar radiculopathy.  Will discharge home with a course of steroids and muscle relaxants and spine follow up    Amount and/or Complexity of Data Reviewed External Data Reviewed: labs and radiology. Labs: ordered. Decision-making details documented in ED Course. Radiology: ordered and independent interpretation performed. Decision-making details documented in ED Course.

## 2021-08-25 ENCOUNTER — Ambulatory Visit: Payer: Self-pay | Admitting: Physician Assistant

## 2021-10-10 ENCOUNTER — Ambulatory Visit: Payer: Self-pay | Admitting: *Deleted

## 2021-10-10 NOTE — Telephone Encounter (Signed)
?  Summary: ear hurt without pain  med wants fu nurse  ? Pt calling in wanting appt for her ears hurting, taking medication for pain but if does not  take ears hurt again, has appt with Raul Del  3/15 but wants sooner appt, not available, wants a FU call with nurse to know what to do, Spanish int. Needed call back [phone]   ?  ? ? ? ?Chief Complaint: ear pain requesting advise and earlier  ?Symptoms: bilateral ear pain, headache, nausea at times ?Frequency: one week  ?Pertinent Negatives: Patient denies fever no drainage from ears , can hear ?Disposition: [] ED /[x] Urgent Care (no appt availability in office) / [] Appointment(In office/virtual)/ [x]  [facility]/ [] Home Care/ [] Refused Recommended Disposition /[] [facility]/ []  Follow-up with PCP ?Additional Notes:  ? ?Requesting earlier appt than 10/19/21. None available. Recommended UC/ PCE walk in with [facility]. Patient requesting medication that was given previously for ear infection, can not remember what was given. Please advise.  ? ? ? ? ? ?Reason for Disposition ? [1] SEVERE pain AND [2] not improved 2 hours after taking analgesic medication (e.g., ibuprofen or acetaminophen) ? ?Answer Assessment - Initial Assessment Questions ?1. LOCATION: "Which ear is involved?" ?    Both ears  ?2. ONSET: "When did the ear start hurting"  ?    One week  ?3. SEVERITY: "How bad is the pain?"  (Scale 1-10; mild, moderate or severe) ?  - MILD (1-3): doesn't interfere with normal activities  ?  - MODERATE (4-7): interferes with normal activities or awakens from sleep  ?  - SEVERE (8-10): excruciating pain, unable to do any normal activities  ?    Moderate to severe esp when she does not take pain medication  ?4. URI SYMPTOMS: "Do you have a runny nose or cough?" ?    no ?5. FEVER: "Do you have a fever?" If Yes, ask: "What is your temperature, how was it measured, and when did it start?" ?    no ?6. CAUSE: "Have you been swimming recently?", "How often  do you use Q-TIPS?", "Have you had any recent air travel or scuba diving?" ?    Na  ?7. OTHER SYMPTOMS: "Do you have any other symptoms?" (e.g., headache, stiff neck, dizziness, vomiting, runny nose, decreased hearing) ?    Headache , nausea at times  ?8. PREGNANCY: "Is there any chance you are pregnant?" "When was your last menstrual period?" ?    na ? ?Protocols used: Earache-A-AH ? ?

## 2021-10-10 NOTE — Telephone Encounter (Signed)
I returned pt's call using Geophysical data processor for [place].  Christiana Pellant [phone]. ? ?Left message to call back.   ? ? ? ? ? ?

## 2021-10-10 NOTE — Telephone Encounter (Signed)
Schedule appt at [facility] on 10/11/2021 with provider nichols at 0800 ? ?

## 2021-10-11 ENCOUNTER — Ambulatory Visit (INDEPENDENT_AMBULATORY_CARE_PROVIDER_SITE_OTHER): Payer: Self-pay | Admitting: Nurse Practitioner

## 2021-10-11 ENCOUNTER — Encounter: Payer: Self-pay | Admitting: Nurse Practitioner

## 2021-10-11 ENCOUNTER — Other Ambulatory Visit: Payer: Self-pay

## 2021-10-11 VITALS — BP 107/68 | HR 62 | Resp 18 | Ht 63.0 in | Wt 207.5 lb

## 2021-10-11 DIAGNOSIS — J020 Streptococcal pharyngitis: Secondary | ICD-10-CM

## 2021-10-11 DIAGNOSIS — Z8619 Personal history of other infectious and parasitic diseases: Secondary | ICD-10-CM

## 2021-10-11 DIAGNOSIS — R110 Nausea: Secondary | ICD-10-CM

## 2021-10-11 DIAGNOSIS — H9203 Otalgia, bilateral: Secondary | ICD-10-CM

## 2021-10-11 LAB — POCT RAPID STREP A (OFFICE): Rapid Strep A Screen: NEGATIVE

## 2021-10-11 MED ORDER — ONDANSETRON 8 MG PO TBDP
8.0000 mg | ORAL_TABLET | Freq: Three times a day (TID) | ORAL | 0 refills | Status: DC | PRN
  Filled 2021-10-11: qty 20, 7d supply, fill #0

## 2021-10-11 MED ORDER — AMOXICILLIN-POT CLAVULANATE 875-125 MG PO TABS
1.0000 | ORAL_TABLET | Freq: Two times a day (BID) | ORAL | 0 refills | Status: DC
  Filled 2021-10-11: qty 20, 10d supply, fill #0

## 2021-10-11 NOTE — Progress Notes (Signed)
Ear pain X week Stomach pain that causes vomiting X 1.5 weeks  ?

## 2021-10-11 NOTE — Progress Notes (Signed)
? ?@Patient  ID: , female    DOB: 1986-06-03, 36 y.o.   MRN: [id number] ? ?Chief Complaint  ?Patient presents with  ? Ear Pain  ? Abdominal Pain  ? ? ?Referring provider: ?[id number], NP ? ?HPI ? ?Patient presents today for sick visit. Symptoms started 1.5 weeks ago and include bilateral ear pain, sore throat, chills, nausea. Patient states that she has history of H.Pylori and has chronic GI and bowel upset. Would like referral to GI. Denies f/c/s, n/v/d, hemoptysis, PND, chest pain or edema. ? ? ?No Known Allergies ? ?Immunization History  ?Administered Date(s) Administered  ? Influenza Split 06/04/2014  ? PFIZER(Purple Top)SARS-COV-2 Vaccination 03/21/2020  ? Tdap 06/12/2014  ? ? ?Past Medical History:  ?Diagnosis Date  ? Anxiety   ? Depression   ? mild pp depression after 1st pregnancy  ? Gastritis   ? GERD (gastroesophageal reflux disease)   ? Heart murmur   ? maybe in the past but many years ago  ? UTI (lower urinary tract infection)   ? ? ?Tobacco History: ?Social History  ? ?Tobacco Use  ?Smoking Status Never  ?Smokeless Tobacco Never  ? ?Counseling given: Not Answered ? ? ?Outpatient Encounter Medications as of 10/11/2021  ?Medication Sig  ? amoxicillin-clavulanate (AUGMENTIN) 875-125 MG tablet Take 1 tablet by mouth 2 (two) times daily.  ? cyclobenzaprine (FLEXERIL) 5 MG tablet Take 1 tablet (5 mg total) by mouth 3 (three) times daily as needed for muscle spasms.  ? fluticasone (FLONASE) 50 MCG/ACT nasal spray Place 2 sprays into both nostrils daily.  ? omeprazole (PRILOSEC) 40 MG capsule Take 1 capsule (40 mg total) by mouth daily. Take daily for 3 months  ? ondansetron (ZOFRAN-ODT) 8 MG disintegrating tablet Take 1 tablet (8 mg total) by mouth every 8 (eight) hours as needed for nausea or vomiting.  ? paragard intrauterine copper IUD IUD 1 each by Intrauterine route once.  ? predniSONE (DELTASONE) 20 MG tablet Take 60 mg daily x 2 days then 40 mg daily x 2 days then 20 mg daily  x 2 days  ? sucralfate (CARAFATE) 1 g tablet Take 1 tablet between meals and at bed time  ? [DISCONTINUED] ondansetron (ZOFRAN-ODT) 8 MG disintegrating tablet Take 1 tablet (8 mg total) by mouth every 8 (eight) hours as needed for nausea or vomiting.  ? ?No facility-administered encounter medications on file as of 10/11/2021.  ? ? ? ?Review of Systems ? ?Review of Systems  ?Constitutional: Negative.   ?HENT:  Positive for ear pain and sore throat.   ?Respiratory:  Negative for cough and shortness of breath.   ?Cardiovascular: Negative.   ?Gastrointestinal:  Positive for nausea.  ?Allergic/Immunologic: Negative.   ?Neurological: Negative.   ?Psychiatric/Behavioral: Negative.     ? ? ? ?Physical Exam ? ?BP 107/68   Pulse 62   Resp 18   Ht 5\' 3"  (1.6 m)   Wt 207 lb 8 oz (94.1 kg)   SpO2 97%   BMI 36.76 kg/m?  ? ?Wt Readings from Last 5 Encounters:  ?10/11/21 207 lb 8 oz (94.1 kg)  ?08/16/21 204 lb (92.5 kg)  ?04/20/21 202 lb 8 oz (91.9 kg)  ?09/15/20 183 lb (83 kg)  ?06/18/20 196 lb (88.9 kg)  ? ? ? ?Physical Exam ?Vitals and nursing note reviewed.  ?Constitutional:   ?   General: She is not in acute distress. ?   Appearance: She is well-developed.  ?HENT:  ?   Mouth/Throat:  ?  Pharynx: Posterior oropharyngeal erythema present.  ?Cardiovascular:  ?   Rate and Rhythm: Normal rate and regular rhythm.  ?Pulmonary:  ?   Effort: Pulmonary effort is normal.  ?   Breath sounds: Normal breath sounds.  ?Neurological:  ?   Mental Status: She is alert and oriented to person, place, and time.  ? ? ? ?Lab Results: ? ?CBC ?   ?Component Value Date/Time  ? WBC 9.3 08/16/2021 1955  ? RBC 4.93 08/16/2021 1955  ? HGB 13.4 08/16/2021 1955  ? HGB 11.3 04/22/2020 1005  ? HCT 41.2 08/16/2021 1955  ? HCT 35.0 04/22/2020 1005  ? PLT 244 08/16/2021 1955  ? PLT 225 04/22/2020 1005  ? MCV 83.6 08/16/2021 1955  ? MCV 82 04/22/2020 1005  ? MCH 27.2 08/16/2021 1955  ? MCHC 32.5 08/16/2021 1955  ? RDW 13.8 08/16/2021 1955  ? RDW 13.5  04/22/2020 1005  ? LYMPHSABS 3.2 08/16/2021 1955  ? LYMPHSABS 2.4 04/22/2020 1005  ? MONOABS 0.7 08/16/2021 1955  ? EOSABS 0.3 08/16/2021 1955  ? EOSABS 0.2 04/22/2020 1005  ? BASOSABS 0.1 08/16/2021 1955  ? BASOSABS 0.0 04/22/2020 1005  ? ? ?BMET ?   ?Component Value Date/Time  ? NA 134 (L) 08/16/2021 1955  ? NA 139 04/20/2021 1549  ? K 3.9 08/16/2021 1955  ? CL 101 08/16/2021 1955  ? CO2 25 08/16/2021 1955  ? GLUCOSE 93 08/16/2021 1955  ? BUN 14 08/16/2021 1955  ? BUN 18 04/20/2021 1549  ? CREATININE 0.68 08/16/2021 1955  ? CALCIUM 9.3 08/16/2021 1955  ? GFRNONAA >60 08/16/2021 1955  ? GFRAA 129 09/15/2020 1036  ? ? ?BNP ?No results found for: BNP ? ?ProBNP ?No results found for: PROBNP ? ?Imaging: ?No results found. ? ? ?Assessment & Plan:  ? ?Acute ear pain, bilateral ?- amoxicillin-clavulanate (AUGMENTIN) 875-125 MG tablet; Take 1 tablet by mouth 2 (two) times daily.  Dispense: 20 tablet; Refill: 0 ? ?2. Nausea without vomiting ? ?- Ambulatory referral to Gastroenterology ?- ondansetron (ZOFRAN-ODT) 8 MG disintegrating tablet; Take 1 tablet (8 mg total) by mouth every 8 (eight) hours as needed for nausea or vomiting.  Dispense: 20 tablet; Refill: 0 ? ?3. History of Helicobacter pylori infection ? ?- Ambulatory referral to Gastroenterology ? ?4. Streptococcal sore throat ? ?- Rapid Strep A ?- COVID-19, Flu A+B and RSV ?- amoxicillin-clavulanate (AUGMENTIN) 875-125 MG tablet; Take 1 tablet by mouth 2 (two) times daily.  Dispense: 20 tablet; Refill: 0 ? ? ? ?Follow up: ? ?Follow up with PCP if needed ? ? ? ? ?Ivonne Andrew, NP ?10/11/2021 ? ? ?

## 2021-10-11 NOTE — Addendum Note (Signed)
Addended by: Vertis Kelch on: 10/11/2021 01:08 PM ? ? Modules accepted: Orders ? ?

## 2021-10-11 NOTE — Assessment & Plan Note (Signed)
-   amoxicillin-clavulanate (AUGMENTIN) 875-125 MG tablet; Take 1 tablet by mouth 2 (two) times daily.  Dispense: 20 tablet; Refill: 0 ? ?2. Nausea without vomiting ? ?- Ambulatory referral to Gastroenterology ?- ondansetron (ZOFRAN-ODT) 8 MG disintegrating tablet; Take 1 tablet (8 mg total) by mouth every 8 (eight) hours as needed for nausea or vomiting.  Dispense: 20 tablet; Refill: 0 ? ?3. History of Helicobacter pylori infection ? ?- Ambulatory referral to Gastroenterology ? ?4. Streptococcal sore throat ? ?- Rapid Strep A ?- COVID-19, Flu A+B and RSV ?- amoxicillin-clavulanate (AUGMENTIN) 875-125 MG tablet; Take 1 tablet by mouth 2 (two) times daily.  Dispense: 20 tablet; Refill: 0 ? ? ? ?Follow up: ? ?Follow up with PCP if needed ?

## 2021-10-11 NOTE — Patient Instructions (Addendum)
1. Acute ear pain, bilateral ? ?- amoxicillin-clavulanate (AUGMENTIN) 875-125 MG tablet; Take 1 tablet by mouth 2 (two) times daily.  Dispense: 20 tablet; Refill: 0 ? ?2. Nausea without vomiting ? ?- Ambulatory referral to Gastroenterology ?- ondansetron (ZOFRAN-ODT) 8 MG disintegrating tablet; Take 1 tablet (8 mg total) by mouth every 8 (eight) hours as needed for nausea or vomiting.  Dispense: 20 tablet; Refill: 0 ? ?3. History of Helicobacter pylori infection ? ?- Ambulatory referral to Gastroenterology ? ?4. Streptococcal sore throat ? ?- Rapid Strep A ?- COVID-19, Flu A+B and RSV ?- amoxicillin-clavulanate (AUGMENTIN) 875-125 MG tablet; Take 1 tablet by mouth 2 (two) times daily.  Dispense: 20 tablet; Refill: 0 ? ? ? ?Follow up: ? ?Follow up with PCP if needed ?

## 2021-10-12 ENCOUNTER — Other Ambulatory Visit: Payer: Self-pay

## 2021-10-12 LAB — COVID-19, FLU A+B AND RSV
Influenza A, NAA: NOT DETECTED
Influenza B, NAA: NOT DETECTED
RSV, NAA: NOT DETECTED
SARS-CoV-2, NAA: NOT DETECTED

## 2021-10-14 LAB — CULTURE, GROUP A STREP: Strep A Culture: NEGATIVE

## 2021-10-19 ENCOUNTER — Ambulatory Visit: Payer: Self-pay | Admitting: Nurse Practitioner

## 2021-10-24 ENCOUNTER — Other Ambulatory Visit: Payer: Self-pay | Admitting: Gastroenterology

## 2021-10-24 ENCOUNTER — Ambulatory Visit: Payer: Self-pay | Admitting: *Deleted

## 2021-10-24 ENCOUNTER — Other Ambulatory Visit: Payer: Self-pay | Admitting: Nurse Practitioner

## 2021-10-24 DIAGNOSIS — R1013 Epigastric pain: Secondary | ICD-10-CM

## 2021-10-24 DIAGNOSIS — K2970 Gastritis, unspecified, without bleeding: Secondary | ICD-10-CM

## 2021-10-24 NOTE — Telephone Encounter (Signed)
?  Chief Complaint: worsening pain requesting refill omeprazole  ?Symptoms: middle abdominal pain constant, hx ulcer. nausea ?Frequency: na ?Pertinent Negatives: Patient denies chest pain , difficulty breathing, no vomiting, no blood in stools ?Disposition: [] ED /[x] Urgent Care (no appt availability in office) / [] Appointment(In office/virtual)/ []  [facility]/ [] Home Care/ [x] Refused Recommended Disposition /[] [place] [facility]/ [x]  Follow-up with PCP ?Additional Notes:  ? ?No available OV within 4 hours. Recommended UC/ED. Patient reports anytime she goes to ED  they do not give her anything and she has to F/U with PCP. Requesting another test for H. Pylori. Please advise if appt available tomorrow or if patient can get something stronger than omeprazole before getting a refill. Please advise.  ? ? ?Reason for Disposition ? [1] MILD-MODERATE pain AND [2] constant AND [3] present > 2 hours ? ?Answer Assessment - Initial Assessment Questions ?1. LOCATION: "Where does it hurt?"  ?    Middle of stomach  ?2. RADIATION: "Does the pain shoot anywhere else?" (e.g., chest, back) ?    No  ?3. ONSET: "When did the pain begin?" (e.g., minutes, hours or days ago)  ?    When eating hurts more. ?4. SUDDEN: "Gradual or sudden onset?" ?    Hx stomach ulcers  ?5. PATTERN "Does the pain come and go, or is it constant?" ?   - If constant: "Is it getting better, staying the same, or worsening?"  ?    (Note: Constant means the pain never goes away completely; most serious pain is constant and it progresses)  ?   - If intermittent: "How long does it last?" "Do you have pain now?" ?    (Note: Intermittent means the pain goes away completely between bouts) ?    Constant  ?6. SEVERITY: "How bad is the pain?"  (e.g., Scale 1-10; mild, moderate, or severe) ?  - MILD (1-3): doesn't interfere with normal activities, abdomen soft and not tender to touch  ?  - MODERATE (4-7): interferes with normal activities or awakens from  sleep, abdomen tender to touch  ?  - SEVERE (8-10): excruciating pain, doubled over, unable to do any normal activities  ?    severe ?7. RECURRENT SYMPTOM: "Have you ever had this type of stomach pain before?" If Yes, ask: "When was the last time?" and "What happened that time?"  ?    Yes  ?8. CAUSE: "What do you think is causing the stomach pain?" ?    Not sure  ?9. RELIEVING/AGGRAVATING FACTORS: "What makes it better or worse?" (e.g., movement, antacids, bowel movement) ?    Worse eating  ?10. OTHER SYMPTOMS: "Do you have any other symptoms?" (e.g., back pain, diarrhea, fever, urination pain, vomiting) ?      Nausea ?11. PREGNANCY: "Is there any chance you are pregnant?" "When was your last menstrual period?" ?      na ? ?Protocols used: Abdominal Pain - Female-A-AH ? ?

## 2021-10-24 NOTE — Telephone Encounter (Unsigned)
Copied from CRM [id number]. Topic: Quick Communication - Rx Refill/Question ?>> Oct 24, 2021  3:19 PM Jaquita Rector A wrote: ?Medication: omeprazole (PRILOSEC) 40 MG capsule ? ?Has the patient contacted their pharmacy? Yes.   ?(Agent: If no, request that the patient contact the pharmacy for the refill. If patient does not wish to contact the pharmacy document the reason why and proceed with request.) ?(Agent: If yes, when and what did the pharmacy advise?) ? ?Preferred Pharmacy (with phone number or street name): [facility] - [place], [place] - [address]  ?Phone:  [phone] ?Fax:  [phone] ? ? ? ?Has the patient been seen for an appointment in the last year OR does the patient have an upcoming appointment? Yes.   ? ?Agent: Please be advised that RX refills may take up to 3 business days. We ask that you follow-up with your pharmacy. ?

## 2021-10-25 ENCOUNTER — Other Ambulatory Visit: Payer: Self-pay | Admitting: Nurse Practitioner

## 2021-10-25 DIAGNOSIS — R1013 Epigastric pain: Secondary | ICD-10-CM

## 2021-10-25 DIAGNOSIS — K2970 Gastritis, unspecified, without bleeding: Secondary | ICD-10-CM

## 2021-10-25 MED ORDER — OMEPRAZOLE 20 MG PO CPDR
40.0000 mg | DELAYED_RELEASE_CAPSULE | Freq: Every day | ORAL | 0 refills | Status: DC
  Filled 2021-10-25: qty 180, 90d supply, fill #0

## 2021-10-25 NOTE — Telephone Encounter (Signed)
Called pt left VM Rx was sent  ?

## 2021-10-25 NOTE — Telephone Encounter (Signed)
omeprazole (PRILOSEC) 40 MG capsule 90 capsule 3 04/20/2021    ?Sig - Route: Take 1 capsule (40 mg total) by mouth daily. Take daily for 3 months - Oral   ?Sent to pharmacy as: omeprazole (PRILOSEC) 40 MG capsule   ?Pt states that this was supposed to be sent yesterday but [facility] [address] did not receive.  ?[facility] [place], [place] - [address] Phone:  [phone]  ?Fax:  [phone]  ?  ?Pt FU # [phone] ?

## 2021-10-25 NOTE — Telephone Encounter (Signed)
She was a no show for her appt last week. Omeprazole has been sent. She has been referred to [facility] . She can call them to schedule an appt ?

## 2021-10-26 ENCOUNTER — Other Ambulatory Visit: Payer: Self-pay | Admitting: Gastroenterology

## 2021-10-26 ENCOUNTER — Other Ambulatory Visit: Payer: Self-pay

## 2021-10-26 DIAGNOSIS — R1013 Epigastric pain: Secondary | ICD-10-CM

## 2021-10-26 DIAGNOSIS — K2970 Gastritis, unspecified, without bleeding: Secondary | ICD-10-CM

## 2021-11-02 ENCOUNTER — Other Ambulatory Visit: Payer: Self-pay

## 2021-11-30 ENCOUNTER — Encounter: Payer: Self-pay | Admitting: Physician Assistant

## 2021-11-30 ENCOUNTER — Ambulatory Visit: Payer: Self-pay

## 2021-11-30 ENCOUNTER — Ambulatory Visit (INDEPENDENT_AMBULATORY_CARE_PROVIDER_SITE_OTHER): Payer: Self-pay | Admitting: Physician Assistant

## 2021-11-30 VITALS — BP 109/74 | HR 80 | Temp 98.2°F | Resp 18 | Ht 63.0 in | Wt 204.0 lb

## 2021-11-30 DIAGNOSIS — R197 Diarrhea, unspecified: Secondary | ICD-10-CM

## 2021-11-30 DIAGNOSIS — R112 Nausea with vomiting, unspecified: Secondary | ICD-10-CM

## 2021-11-30 DIAGNOSIS — Z8619 Personal history of other infectious and parasitic diseases: Secondary | ICD-10-CM

## 2021-11-30 DIAGNOSIS — A048 Other specified bacterial intestinal infections: Secondary | ICD-10-CM

## 2021-11-30 MED ORDER — ONDANSETRON 8 MG PO TBDP: 8.0000 mg | ORAL_TABLET | Freq: Three times a day (TID) | ORAL | 0 refills | Status: DC | PRN

## 2021-11-30 MED ORDER — ONDANSETRON 4 MG PO TBDP
4.0000 mg | ORAL_TABLET | Freq: Once | ORAL | Status: AC
  Administered 2021-11-30: 4 mg via ORAL

## 2021-11-30 NOTE — Patient Instructions (Signed)
I encourage you to take the Zofran to help you with your nausea, and begin giving yourself gentle nutrition and gentle hydration.  You should eat foods that are very easy to digest, a very bland diet.  You can then advance your diet as tolerated. ? ?We will call you with today's lab result. ? ?Roney Jaffe, PA-C ?Physician Assistant ?[facility] ?[email or web address] ? ?Gastroenteritis viral en los adultos ?Viral Gastroenteritis, Adult ? ?La gastroenteritis viral tambi?n se conoce como gripe estomacal. Esta afecci?n podr?a afectar el est?mago, el intestino delgado y el intestino grueso. Puede causar diarrea l?quida, fiebre y v?mitos repentinos. Esta afecci?n es causada por muchos virus diferentes. Estos virus pueden transmitirse de Neomia Dear persona a otra con mucha facilidad (son contagiosos). ?La diarrea y los v?mitos pueden hacerlo sentir d?bil y causar deshidrataci?n. Es posible que no pueda retener los l?quidos. La deshidrataci?n puede causarle cansancio, sed, sequedad en la boca y disminuci?n en la frecuencia con la que orina. Es importante restituir los l?quidos que pierde por causa de la diarrea y los v?mitos. ??Cu?les son las causas? ?La gastroenteritis es causada por muchos virus, entre los que se incluyen el rotavirus y el norovirus. El norovirus es la causa m?s frecuente en los adultos. Puede enfermarse despu?s de estar expuesto a los virus de Economist. Tambi?n puede enfermarse de las siguientes maneras: ?A trav?s de la ingesta de alimentos o agua contaminados, o por tocar superficies contaminadas con alguno de estos virus. ?Al compartir utensilios u otros art?culos personales con una persona infectada. ??Qu? incrementa el riesgo? ?Es m?s probable que tenga esta afecci?n si: ?Tiene debilitado el sistema de defensa del organismo (sistema inmunitario). ?Vive con uno o m?s ni?os menores de 2 a?os. ?Vive en un hogar de ancianos. ?Viaja en un  crucero. ??Cu?les son los signos o s?ntomas? ?Los s?ntomas de esta afecci?n suelen aparecer entre 1 y 3 d?as despu?s de la exposici?n al virus. Pueden durar algunos d?as o incluso una semana. Los s?ntomas frecuentes son diarrea l?quida y v?mitos. Otros s?ntomas pueden incluir los siguientes: ?Teacher, English as a foreign language. ?Dolor de [place]. ?Fatiga. ?Dolor en el abdomen. ?Escalofr?os. ?Debilidad. ?N?useas. ?Dolores musculares. ?P?rdida del apetito. ??C?mo se diagnostica? ?Esta afecci?n se diagnostica mediante una revisi?n de los antecedentes m?dicos y un examen f?sico. Tambi?n podr?an hacerle un an?lisis de materia fecal para detectar virus u otras infecciones. ??C?mo se trata? ?Por lo general, esta afecci?n desaparece por s? sola. El tratamiento se centra en prevenir la deshidrataci?n y reponer los l?quidos perdidos (rehidrataci?n). El tratamiento de esta afecci?n puede incluir: ?Una soluci?n de rehidrataci?n oral (SRO) para Surveyor, quantity y Energy manager (electrolitos) importantes en el cuerpo. T?mela si se lo indic? el m?dico. Esta es una bebida que se vende en farmacias y tiendas minoristas. ?Medicamentos para [employer]. ?Suplementos probi?ticos para disminuir los s?ntomas de diarrea. ?Administraci?n de l?quidos por v?a intravenosa si la deshidrataci?n es grave. ?Los [employer] y las personas que tienen otras enfermedades o un sistema inmunitario d?bil est?n en mayor riesgo de deshidrataci?n. ?Siga estas indicaciones en su casa: ?Comida y bebida ? ?Tome una SRO como se lo haya indicado el m?dico. ?En la medida en que pueda, beba l?quidos transparentes en peque?as cantidades. Los l?quidos transparentes son, por ejemplo: ?Westley Hummer. ?Trocitos de hielo. ?Jugo de frutas diluido. ?Bebidas deportivas de bajas calor?as. ?Beba suficiente l?quido como para mantener la orina de color amarillo p?lido. ?Coma peque?as cantidades de alimentos saludables cada 3 a 4 horas seg?n su tolerancia. Estos pueden incluir cereales integrales, frutas,  verduras,  carnes magras y Dentist. ?Evite consumir l?quidos que contengan mucha az?car o cafe?na, como bebidas energ?ticas, bebidas deportivas y refrescos. ?Evite los alimentos condimentados o con alto contenido de [place]. ?Evite tomar alcohol. ?Indicaciones generales ? ?L?vese las manos con frecuencia, especialmente despu?s de tener diarrea o v?mitos. Use desinfectante para manos si no dispone de agua y jab?n. ?Aseg?rese de que todas las personas que viven en su casa se laven bien las manos y con frecuencia. ?Use los medicamentos de venta libre y los recetados solamente como se lo haya indicado el m?dico. ?Descanse en su casa mientras se recupera. ?Controle su afecci?n para detectar cualquier cambio. ?Tome un ba?o caliente para ayudar a Engineer, manufacturing systems ardor o el dolor causados por los episodios frecuentes de diarrea. ?Concurra a todas las visitas de seguimiento. Esto es importante. ?Comun?quese con un m?dico si: ?No retiene los l?quidos. ?Tiene s?ntomas que empeoran. ?Tiene nuevos s?ntomas. ?Se siente mareado o siente que va a desvanecerse. ?Presenta calambres musculares. ?Solicite ayuda de inmediato si: ?Siente dolor en el pecho. ?Tiene problemas para respirar o respira muy r?pidamente. ?Tiene latidos card?acos acelerados. ?Se siente muy d?bil o se desmaya. ?Siente dolor de cabeza intenso, rigidez en el cuello, o ambas cosas. ?Tiene una erupci?n cut?nea. ?Tiene dolor intenso, c?licos o distensi?n en el abdomen. ?Tiene la piel fr?a y h?meda. ?Se siente confundido. ?Tiene dolor al [employer]. ?Tiene signos de deshidrataci?n, como los siguientes: ?Orina de color oscuro, muy escasa o falta de [place]. ?Labios agrietados. ?Sequedad de boca. ?Ojos hundidos. ?Somnolencia. ?Debilidad. ?Presenta signos de sangrado, como los siguientes: ?Observa sangre en el v?mito. ?Tiene v?mitos que se asemejan al poso del caf?Marland Kitchen ?Hace heces sanguinolentas, negras o con aspecto alquitranado. ?Estos s?ntomas pueden Customer service manager. Solicite  ayuda de inmediato. Llame al 911. ?No espere a ver si los s?ntomas desaparecen. ?No conduzca por sus propios medios [employer]. ?Resumen ?La gastroenteritis viral tambi?n se conoce como gripe estomacal. Puede causar diarrea l?quida, fiebre y v?mitos repentinos. ?Esta afecci?n se puede transmitir de [place] persona a otra con mucha facilidad (es contagiosa). ?Beba una soluci?n de rehidrataci?n oral (SRO) si se lo indic? el m?dico. Esta es una bebida que se vende en farmacias y tiendas minoristas. ?L?vese las manos con frecuencia, especialmente despu?s de tener diarrea o v?mitos. Use desinfectante para manos si no dispone de agua y jab?n. ?Esta informaci?n no tiene Theme park manager el consejo del m?dico. Aseg?rese de hacerle al m?dico cualquier pregunta que tenga. ?Document Revised: 06/15/2021 Document Reviewed: 06/15/2021 ?Elsevier Patient Education ? 2023 Elsevier Inc. ? ?

## 2021-11-30 NOTE — Progress Notes (Signed)
? ?Established Patient Office Visit ? ?Subjective   ?Patient ID: Melanie Newman, female    DOB: 03/28/1986  Age: 36 y.o. MRN: [id number] ? ?Chief Complaint  ?Patient presents with  ? Vomiting  ? ? ?States that she started having vomiting today, states that she started feeling poorly "out of nowhere", is also having diarrhea ? ?States that she has been having chills, body aches, headaches, feel bloated ? ?States that she had been having burning in her stomach for a few days prior (points to epigastric area) ? ?Denies sick contacts, does not think she ate anything spoiled. ? ?States that she is still taking prilosec 40 mg daily. ? ?Does endorse history of h pylori. ? ?Due to language barrier, an interpreter was present during the history-taking and subsequent discussion (and for part of the physical exam) with this patient.  ? ? ?Past Medical History:  ?Diagnosis Date  ? Anxiety   ? Depression   ? mild pp depression after 1st pregnancy  ? Gastritis   ? GERD (gastroesophageal reflux disease)   ? Heart murmur   ? maybe in the past but many years ago  ? UTI (lower urinary tract infection)   ? ?Social History  ? ?Socioeconomic History  ? Marital status: Single  ?  Spouse name: Not on file  ? Number of children: Not on file  ? Years of education: Not on file  ? Highest education level: Not on file  ?Occupational History  ? Not on file  ?Tobacco Use  ? Smoking status: Never  ? Smokeless tobacco: Never  ?Vaping Use  ? Vaping Use: Never used  ?Substance and Sexual Activity  ? Alcohol use: Yes  ?  Comment: social  ? Drug use: No  ? Sexual activity: Not Currently  ?  Birth control/protection: I.U.D.  ?Other Topics Concern  ? Not on file  ?Social History Narrative  ? ** Merged History Encounter **  ?    ? ?Social Determinants of Health  ? ?Financial Resource Strain: Not on file  ?Food Insecurity: Not on file  ?Transportation Needs: Not on file  ?Physical Activity: Not on file  ?Stress: Not on file  ?Social Connections:  Not on file  ?Intimate Partner Violence: Not on file  ? ?Family History  ?Problem Relation Age of Onset  ? Osteoarthritis Mother   ? Renal Disease Brother   ? Jaundice Nephew   ?     unknown reason  ? Diabetes Neg Hx   ? Hypertension Neg Hx   ? Hyperlipidemia Neg Hx   ? Colon cancer Neg Hx   ? Stomach cancer Neg Hx   ? Pancreatic cancer Neg Hx   ? Esophageal cancer Neg Hx   ? Rectal cancer Neg Hx   ? ?No Known Allergies ?  ? ?Review of Systems  ?Constitutional:  Positive for chills. Negative for fever.  ?HENT: Negative.    ?Eyes: Negative.   ?Respiratory:  Negative for shortness of breath.   ?Cardiovascular:  Negative for chest pain.  ?Gastrointestinal:  Positive for diarrhea, heartburn, nausea and vomiting. Negative for abdominal pain, blood in stool and constipation.  ?Genitourinary:  Negative for dysuria, frequency and urgency.  ?Musculoskeletal:  Positive for myalgias.  ?Skin: Negative.   ?Neurological:  Positive for headaches.  ?Endo/Heme/Allergies: Negative.   ?Psychiatric/Behavioral: Negative.    ? ?  ?Objective:  ?  ? ?BP 109/74 (BP Location: Right Arm, Patient Position: Sitting, Cuff Size: Large)   Pulse 80   Temp  98.2 ?F (36.8 ?C) (Oral)   Resp 18   Ht 5\' 3"  (1.6 m)   Wt 204 lb (92.5 kg)   SpO2 97%   BMI 36.14 kg/m?  ?  ? ?Physical Exam ?Vitals and nursing note reviewed.  ?Constitutional:   ?   Appearance: Normal appearance. She is obese.  ?HENT:  ?   Head: Normocephalic and atraumatic.  ?   Right Ear: External ear normal.  ?   Left Ear: External ear normal.  ?   Nose: Nose normal.  ?   Mouth/Throat:  ?   Mouth: Mucous membranes are moist.  ?   Pharynx: Oropharynx is clear.  ?Eyes:  ?   Extraocular Movements: Extraocular movements intact.  ?   Conjunctiva/sclera: Conjunctivae normal.  ?   Pupils: Pupils are equal, round, and reactive to light.  ?Cardiovascular:  ?   Rate and Rhythm: Normal rate and regular rhythm.  ?   Pulses: Normal pulses.  ?   Heart sounds: Normal heart sounds.  ?Pulmonary:  ?    Effort: Pulmonary effort is normal.  ?   Breath sounds: Normal breath sounds.  ?Abdominal:  ?   General: Abdomen is flat. Bowel sounds are increased.  ?   Palpations: Abdomen is soft.  ?   Tenderness: There is generalized abdominal tenderness. Negative signs include Murphy's sign.  ?Musculoskeletal:     ?   General: Normal range of motion.  ?   Cervical back: Normal range of motion and neck supple.  ?Skin: ?   General: Skin is warm and dry.  ?Neurological:  ?   General: No focal deficit present.  ?   Mental Status: She is alert and oriented to person, place, and time.  ?Psychiatric:     ?   Mood and Affect: Mood normal.     ?   Behavior: Behavior normal.     ?   Thought Content: Thought content normal.     ?   Judgment: Judgment normal.  ? ? ? ? ?  ?Assessment & Plan:  ? ?Problem List Items Addressed This Visit   ?None ?Visit Diagnoses   ? ? Nausea and vomiting, unspecified vomiting type    -  Primary  ? Relevant Medications  ? ondansetron (ZOFRAN-ODT) 8 MG disintegrating tablet  ? ondansetron (ZOFRAN-ODT) disintegrating tablet 4 mg  ? Other Relevant Orders  ? H Pylori, IGM, IGG, IGA AB  ? Diarrhea, unspecified type      ? Relevant Orders  ? H Pylori, IGM, IGG, IGA AB  ? History of Helicobacter pylori infection      ? Relevant Orders  ? H Pylori, IGM, IGG, IGA AB  ? ?  ? ?1. Nausea and vomiting, unspecified vomiting type ?Gastroenteritis versus H. pylori infection.  Continue current Prilosec regimen, trial Zofran.  Patient education given on supportive care, gentle diet.  Red flags given for prompt reevaluation. ? ?Patient given Zofran in clinic ?- H Pylori, IGM, IGG, IGA AB ?- ondansetron (ZOFRAN-ODT) 8 MG disintegrating tablet; Take 1 tablet (8 mg total) by mouth every 8 (eight) hours as needed for nausea or vomiting.  Dispense: 20 tablet; Refill: 0 ?- ondansetron (ZOFRAN-ODT) disintegrating tablet 4 mg ? ?2. Diarrhea, unspecified type ? ?- H Pylori, IGM, IGG, IGA AB ? ?3. History of Helicobacter pylori  infection ? ?- H Pylori, IGM, IGG, IGA AB ? ? ?I have reviewed the patient's medical history (PMH, PSH, Social History, Family History, Medications, and allergies) , and have  been updated if relevant. I spent 30 minutes reviewing chart and  face to face time with patient. ? ? ? ?Return if symptoms worsen or fail to improve.  ? ? ?Williemae Muriel S Mayers, PA-C ? ?

## 2021-11-30 NOTE — Progress Notes (Signed)
Patient has eaten today but vomited in the lobby. ?Patient has taken omeprazole today. ?Patient reports vomiting with light pink blood tinges in the vomit. ?Patient is requesting h pylori testing. ?

## 2021-11-30 NOTE — Telephone Encounter (Signed)
?  Spanish interpreter # [phone] ? ?Chief Complaint: Vomited x 2, small amount dark red blood seen. ?Symptoms: Nausea ?Frequency: Today ?Pertinent Negatives: Patient denies pain ?Disposition: [] ED /[] Urgent Care (no appt availability in office) / [x] Appointment(In office/virtual)/ []  [facility]/ [] Home Care/ [] Refused Recommended Disposition /[] [facility]/ []  Follow-up with PCP ?Additional Notes: Appointment made.  ?Reason for Disposition ? [1] Vomiting AND [2] abdomen looks much more swollen than usual ? ?Answer Assessment - Initial Assessment Questions ?1. APPEARANCE of BLOOD: "What does the blood look like?" (e.g., color, coffee-grounds) ?    Red, dark ?2. AMOUNT: "How much blood was lost?" ?    Small ?3. VOMITING BLOOD: "How many times did it happen?" or "How many times in the past 24 hours?" ?    2 x ?4. VOMITING WITHOUT BLOOD: "How many times in the past 24 hours?"  ?    0 ?5. ONSET: "When did vomiting of blood begin?" ?    This morning ?6. CAUSE: "What do you think is causing the vomiting of blood?" ?    Unsure ?7. BLOOD THINNERS: "Do you take any blood thinners?" (e.g., Coumadin/warfarin, Pradaxa/dabigatran, aspirin) ?    No ?8. DEHYDRATION: "Are there any signs of dehydration?" "When was the last time you urinated?" "Do you feel dizzy?" ?    No ?9. ABDOMINAL PAIN: "Are you having any abdominal pain?" If Yes, ask: "What does it feel like? " (e.g., crampy, dull, intermittent, constant)  ?    Weakness ?10. DIARRHEA: "Is there any diarrhea?" If Yes, ask: "How many times today?"  ?      No ?11. OTHER SYMPTOMS: "Do you have any other symptoms?" (e.g., fever, blood in stool) ?      No ?12. PREGNANCY: "Is there any chance you are pregnant?" "When was your last menstrual period?" ?      No ? ?Protocols used: Vomiting Blood-A-AH ? ?

## 2021-12-05 LAB — H PYLORI, IGM, IGG, IGA AB
H pylori, IgM Abs: 11.6 units — ABNORMAL HIGH (ref 0.0–8.9)
H. pylori, IgA Abs: 19 units — ABNORMAL HIGH (ref 0.0–8.9)
H. pylori, IgG AbS: 0.53 Index Value (ref 0.00–0.79)

## 2021-12-05 MED ORDER — AMOXICILLIN 500 MG PO CAPS: 1000.0000 mg | ORAL_CAPSULE | Freq: Two times a day (BID) | ORAL | 0 refills | Status: AC

## 2021-12-05 MED ORDER — CLARITHROMYCIN 500 MG PO TABS
500.0000 mg | ORAL_TABLET | Freq: Two times a day (BID) | ORAL | 0 refills | Status: AC
Start: 2021-12-05 — End: 2021-12-19

## 2021-12-05 NOTE — Addendum Note (Signed)
Addended by: Kennieth Rad on: 12/05/2021 01:48 PM ? ? Modules accepted: Orders ? ?

## 2021-12-08 ENCOUNTER — Telehealth: Payer: Self-pay | Admitting: *Deleted

## 2021-12-08 NOTE — Telephone Encounter (Signed)
Medical Assistant used [employer] to contact patient.  ?Interpreter Name:Nayeli Interpreter #: [phone] ?Patient was not available x2, [employer] left patient a voicemail. ?Patient is aware of needing to complete treatment for h pylori and to follow up in 2 weeks after completing treatment. ? ?

## 2021-12-08 NOTE — Telephone Encounter (Signed)
-----   Message from Roney Jaffe, [place] sent at 12/05/2021  1:48 PM EDT ----- ?Please call patient and let her know that she was positive for H. pylori, she does have a history of H. pylori, however given her current symptoms I think it is reasonable to go ahead and treat her for this.  She needs to continue taking the omeprazole 40 mg once daily, plus she will also take clarithromycin and amoxicillin as directed for 2 weeks.  I do encourage her to have a follow-up after she completes her treatment.  Prescription sent to pharmacy. ?

## 2022-01-09 ENCOUNTER — Ambulatory Visit: Payer: Self-pay | Admitting: *Deleted

## 2022-01-09 NOTE — Telephone Encounter (Signed)
Reason for Disposition  [1] SEVERE back pain (e.g., excruciating, unable to do any normal activities) AND [2] not improved 2 hours after pain medicine  Answer Assessment - Initial Assessment Questions 1. ONSET: "When did the pain begin?"      Long time- after last child- 5 months 2. LOCATION: "Where does it hurt?" (upper, mid or lower back)     Middle of back- mid 3. SEVERITY: "How bad is the pain?"  (e.g., Scale 1-10; mild, moderate, or severe)   - MILD (1-3): doesn't interfere with normal activities    - MODERATE (4-7): interferes with normal activities or awakens from sleep    - SEVERE (8-10): excruciating pain, unable to do any normal activities      Severe- 10 4. PATTERN: "Is the pain constant?" (e.g., yes, no; constant, intermittent)      constant 5. RADIATION: "Does the pain shoot into your legs or elsewhere?"     no 6. CAUSE:  "What do you think is causing the back pain?"      Unsure- epidural 7 years ago 7. BACK OVERUSE:  "Any recent lifting of heavy objects, strenuous work or exercise?"     *No Answer* 8. MEDICATIONS: "What have you taken so far for the pain?" (e.g., nothing, acetaminophen, NSAIDS)     Tylenol, Ibuprofen 9. NEUROLOGIC SYMPTOMS: "Do you have any weakness, numbness, or problems with bowel/bladder control?"     *No Answer* 10. OTHER SYMPTOMS: "Do you have any other symptoms?" (e.g., fever, abdominal pain, burning with urination, blood in urine)       *No Answer* 11. PREGNANCY: "Is there any chance you are pregnant?" (e.g., yes, no; LMP)       *No Answer*  Protocols used: Back Pain-A-AH

## 2022-01-09 NOTE — Telephone Encounter (Signed)
  Chief Complaint: back pain Symptoms: severe back pain- mid back- swelling present Frequency: chronic- but getting worse Pertinent Negatives: Patient denies other symptoms Disposition: [] ED /[] Urgent Care (no appt availability in office) / [] Appointment(In office/virtual)/ []  [facility]/ [] Home Care/ [] Refused Recommended Disposition /[x] [facility]/ []  Follow-up with PCP Additional Notes: patient does not want to go to UC/ED- no insurance- mobile unit advised or ED if pain gets unbearable before she can be seen Interpreter: 

## 2022-01-10 NOTE — Telephone Encounter (Signed)
Called pt stated she could not go and will go today . Stressed the importance of being evaluated if sy persist or worsen.

## 2022-01-26 ENCOUNTER — Ambulatory Visit: Payer: Self-pay | Admitting: Physician Assistant

## 2022-03-02 ENCOUNTER — Encounter: Payer: Self-pay | Admitting: Physician Assistant

## 2022-03-02 ENCOUNTER — Other Ambulatory Visit: Payer: Self-pay

## 2022-03-02 ENCOUNTER — Ambulatory Visit (INDEPENDENT_AMBULATORY_CARE_PROVIDER_SITE_OTHER): Payer: Self-pay | Admitting: Physician Assistant

## 2022-03-02 VITALS — BP 108/74 | HR 60 | Ht 63.0 in | Wt 203.0 lb

## 2022-03-02 DIAGNOSIS — Z8619 Personal history of other infectious and parasitic diseases: Secondary | ICD-10-CM

## 2022-03-02 DIAGNOSIS — Z8719 Personal history of other diseases of the digestive system: Secondary | ICD-10-CM

## 2022-03-02 DIAGNOSIS — R1013 Epigastric pain: Secondary | ICD-10-CM

## 2022-03-02 LAB — HEPATIC FUNCTION PANEL
ALT: 40 U/L — ABNORMAL HIGH (ref 0–35)
AST: 26 U/L (ref 0–37)
Albumin: 4.4 g/dL (ref 3.5–5.2)
Alkaline Phosphatase: 54 U/L (ref 39–117)
Bilirubin, Direct: 0.2 mg/dL (ref 0.0–0.3)
Total Bilirubin: 1 mg/dL (ref 0.2–1.2)
Total Protein: 7.5 g/dL (ref 6.0–8.3)

## 2022-03-02 MED ORDER — SUCRALFATE 1 G PO TABS
1.0000 g | ORAL_TABLET | Freq: Three times a day (TID) | ORAL | 2 refills | Status: DC
  Filled 2022-03-02: qty 120, 30d supply, fill #0
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
## 2022-03-02 NOTE — Patient Instructions (Addendum)
If you are age 35 or younger, your body mass index should be between 19-25. Your Body mass index is 35.96 kg/m. If this is out of the aformentioned range listed, please consider follow up with your Primary Care Provider.  ________________________________________________________  The [facility] providers would like to encourage you to use [facility] to communicate with providers for non-urgent requests or questions.  Due to long hold times on the telephone, sending your provider a message by [facility] may be a faster and more efficient way to get a response.  Please allow 48 business hours for a response.  Please remember that this is for non-urgent requests.  _______________________________________________________  Your provider has requested that you go to the basement level for lab work before leaving today. Press "B" on the elevator. The lab is located at the first door on the left as you exit the elevator.  Continue to stay off of your Omeprazole for one more week, then complete the stool test and bring it back to the lab. After completing your stool test you can restart your Omeprazole.  START Sucralfate 1 gram 1 tablet before meals and at bedtime  Follow up pending  Thank you for entrusting me with your care and choosing [facility].  Amy Esterwood, PA-C

## 2022-03-07 ENCOUNTER — Other Ambulatory Visit: Payer: Self-pay

## 2022-04-20 ENCOUNTER — Other Ambulatory Visit: Payer: Self-pay

## 2022-04-25 ENCOUNTER — Other Ambulatory Visit: Payer: Self-pay

## 2022-04-25 DIAGNOSIS — Z8719 Personal history of other diseases of the digestive system: Secondary | ICD-10-CM

## 2022-04-25 DIAGNOSIS — Z8619 Personal history of other infectious and parasitic diseases: Secondary | ICD-10-CM

## 2022-04-25 DIAGNOSIS — R1013 Epigastric pain: Secondary | ICD-10-CM

## 2022-04-27 LAB — H. PYLORI ANTIGEN, STOOL: H pylori Ag, Stl: NEGATIVE

## 2022-08-10 ENCOUNTER — Ambulatory Visit: Payer: Self-pay | Admitting: *Deleted

## 2022-08-10 NOTE — Telephone Encounter (Addendum)
  Chief Complaint: upper abdominal pain Symptoms: pain mostly after eating, pressure Frequency: 2 weeks Pertinent Negatives: Patient denies   Disposition: [] ED /[x] Urgent Care (no appt availability in office) / [] Appointment(In office/virtual)/ []  [facility]/ [] Home Care/ [] Refused Recommended Disposition /[] [facility]/ []  Follow-up with PCP Additional Notes: Patient is established wit GI- advised to contact them today- if not UC would be option as well- no open appointment in office  Interpreter: [phone]

## 2022-08-10 NOTE — Telephone Encounter (Signed)
Reason for Disposition  [1] MILD-MODERATE pain AND [2] not relieved by antacid medicine  Answer Assessment - Initial Assessment Questions 1. LOCATION: "Where does it hurt?"      Upper abdomen 2. RADIATION: "Does the pain shoot anywhere else?" (e.g., chest, back)     Into back 3. ONSET: "When did the pain begin?" (e.g., minutes, hours or days ago)      2 weeks 4. SUDDEN: "Gradual or sudden onset?"     Sudden 5. PATTERN "Does the pain come and go, or is it constant?"    - If it comes and goes: "How long does it last?" "Do you have pain now?"     (Note: Comes and goes means the pain is intermittent. It goes away completely between bouts.)    - If constant: "Is it getting better, staying the same, or getting worse?"      (Note: Constant means the pain never goes away completely; most serious pain is constant and gets worse.)      Comes and goes 6. SEVERITY: "How bad is the pain?"  (e.g., Scale 1-10; mild, moderate, or severe)    - MILD (1-3): Doesn't interfere with normal activities, abdomen soft and not tender to touch..     - MODERATE (4-7): Interferes with normal activities or awakens from sleep, abdomen tender to touch.     - SEVERE (8-10): Excruciating pain, doubled over, unable to do any normal activities.       Pressure- moderate 7. RECURRENT SYMPTOM: "Have you ever had this type of stomach pain before?" If Yes, ask: "When was the last time?" and "What happened that time?"      Diagnosed with stomach ulcer in past 8. AGGRAVATING FACTORS: "Does anything seem to cause this pain?" (e.g., foods, stress, alcohol)     Usually after eating 9. CARDIAC SYMPTOMS: "Do you have any of the following symptoms: chest pain, difficulty breathing, sweating, nausea?"     No- palpitations history 10. OTHER SYMPTOMS: "Do you have any other symptoms?" (e.g., back pain, diarrhea, fever, urination pain, vomiting)       nausea 11. PREGNANCY: "Is there any chance you are pregnant?" "When was your last  menstrual period?"  Protocols used: Abdominal Pain - Upper-A-AH

## 2022-08-30 ENCOUNTER — Ambulatory Visit: Payer: Self-pay | Admitting: Physician Assistant

## 2022-10-02 ENCOUNTER — Ambulatory Visit: Payer: Self-pay | Admitting: Physician Assistant

## 2022-10-02 ENCOUNTER — Encounter: Payer: Self-pay | Admitting: Physician Assistant

## 2022-10-02 ENCOUNTER — Other Ambulatory Visit: Payer: Self-pay

## 2022-10-02 VITALS — BP 114/80 | HR 62 | Temp 98.5°F | Ht 63.0 in | Wt 206.0 lb

## 2022-10-02 DIAGNOSIS — J209 Acute bronchitis, unspecified: Secondary | ICD-10-CM

## 2022-10-02 MED ORDER — BENZONATATE 100 MG PO CAPS
ORAL_CAPSULE | ORAL | 0 refills | Status: DC
  Filled 2022-10-02: qty 20, 3d supply, fill #0

## 2022-10-02 MED ORDER — PREDNISONE 20 MG PO TABS
ORAL_TABLET | ORAL | 0 refills | Status: AC
  Filled 2022-10-02: qty 13, 8d supply, fill #0

## 2022-10-02 NOTE — Progress Notes (Signed)
Established Patient Office Visit  Subjective   Patient ID: Melanie Newman, female    DOB: Dec 31, 1985  Age: 37 y.o. MRN: [id number]  Chief Complaint  Patient presents with   Cough    X8 days worsening cough, chest pain, chest burning.     States that she has been having a dry cough intermittently for the past month, worsening in the past 8 days.  States cough tends to be worse in the early morning.  States that she has been having a feeling of burning in her chest, and something "stuck in her throat like phlegm" but is unable to bring anything.  States that her left ear has been more painful when coughing over the past 8 days as well.  Endorses low appetite.  Denies sick contacts, took a COVID test today which was negative.  States that she has tried a natural product for cough without relief.  States that approximately 3 weeks ago her tonsils seem to swollen to her so she took some leftover amoxicillin that she had from a previous illness.  States she took 1 a day for a week.  Due to language barrier, an interpreter was present during the history-taking and subsequent discussion (and for part of the physical exam) with this patient.     Past Medical History:  Diagnosis Date   Anxiety    Depression    mild pp depression after 1st pregnancy   Gastritis    GERD (gastroesophageal reflux disease)    Heart murmur    maybe in the past but many years ago   UTI (lower urinary tract infection)    Social History   Socioeconomic History   Marital status: Single    Spouse name: Not on file   Number of children: Not on file   Years of education: Not on file   Highest education level: Not on file  Occupational History   Not on file  Tobacco Use   Smoking status: Never   Smokeless tobacco: Never  Vaping Use   Vaping Use: Never used  Substance and Sexual Activity   Alcohol use: Yes    Comment: social   Drug use: No   Sexual activity: Not Currently    Birth  control/protection: I.U.D.  Other Topics Concern   Not on file  Social History Narrative   ** Merged History Encounter **       Social Determinants of Health   Financial Resource Strain: Not on file  Food Insecurity: Not on file  Transportation Needs: Not on file  Physical Activity: Not on file  Stress: Not on file  Social Connections: Not on file  Intimate Partner Violence: Not on file   Family History  Problem Relation Age of Onset   Osteoarthritis Mother    Renal Disease Brother    Jaundice Nephew        unknown reason   Diabetes Neg Hx    Hypertension Neg Hx    Hyperlipidemia Neg Hx    Colon cancer Neg Hx    Stomach cancer Neg Hx    Pancreatic cancer Neg Hx    Esophageal cancer Neg Hx    Rectal cancer Neg Hx    No Known Allergies  Review of Systems  Constitutional:  Negative for chills and fever.  HENT:  Positive for ear pain and sore throat. Negative for congestion and sinus pain.   Eyes: Negative.   Respiratory:  Positive for cough. Negative for sputum production, shortness of breath  and wheezing.   Cardiovascular:  Negative for chest pain.  Gastrointestinal:  Negative for abdominal pain, nausea and vomiting.  Genitourinary: Negative.   Musculoskeletal:  Negative for myalgias.  Skin: Negative.   Neurological: Negative.   Endo/Heme/Allergies: Negative.   Psychiatric/Behavioral: Negative.        Objective:     BP 114/80 (BP Location: Left Arm, Patient Position: Sitting, Cuff Size: Large)   Pulse 62   Temp 98.5 F (36.9 C)   Ht '5\' 3"'$  (1.6 m)   Wt 206 lb (93.4 kg)   SpO2 99%   BMI 36.49 kg/m    Physical Exam Vitals and nursing note reviewed.  Constitutional:      Appearance: Normal appearance.  HENT:     Head: Normocephalic and atraumatic.     Right Ear: Tympanic membrane, ear canal and external ear normal.     Left Ear: Tympanic membrane, ear canal and external ear normal.     Nose: Nose normal.     Mouth/Throat:     Mouth: Mucous membranes  are moist.     Pharynx: Oropharynx is clear. No oropharyngeal exudate.  Eyes:     Extraocular Movements: Extraocular movements intact.     Conjunctiva/sclera: Conjunctivae normal.     Pupils: Pupils are equal, round, and reactive to light.  Cardiovascular:     Rate and Rhythm: Normal rate and regular rhythm.     Pulses: Normal pulses.     Heart sounds: Normal heart sounds.  Pulmonary:     Effort: Pulmonary effort is normal.     Breath sounds: Normal breath sounds. No wheezing.  Musculoskeletal:        General: Normal range of motion.     Cervical back: Normal range of motion and neck supple.  Lymphadenopathy:     Cervical: No cervical adenopathy.  Skin:    General: Skin is warm and dry.  Neurological:     General: No focal deficit present.     Mental Status: She is alert and oriented to person, place, and time.  Psychiatric:        Mood and Affect: Mood normal.        Thought Content: Thought content normal.        Judgment: Judgment normal.       Assessment & Plan:   Problem List Items Addressed This Visit   None Visit Diagnoses     Acute bronchitis, unspecified organism    -  Primary   Relevant Medications   benzonatate (TESSALON) 100 MG capsule   predniSONE (DELTASONE) 20 MG tablet      1. Acute bronchitis, unspecified organism Trial Tessalon Perles, prednisone taper.  Patient education given on supportive care, red flags given for prompt reevaluation. - benzonatate (TESSALON) 100 MG capsule; Take 1 to 2 capsules by mouth 3 times daily as needed.  Dispense: 20 capsule; Refill: 0 - predniSONE (DELTASONE) 20 MG tablet; Take 3 tablets (60 mg total) by mouth daily with breakfast for 2 days, THEN 2 tablets (40 mg total) daily with breakfast for 2 days, THEN 1 tablet (20 mg total) daily with breakfast for 2 days, THEN 0.5 tablets (10 mg total) daily with breakfast for 2 days.  Dispense: 13 tablet; Refill: 0   I have reviewed the patient's medical history (PMH, PSH,  Social History, Family History, Medications, and allergies) , and have been updated if relevant. I spent 30 minutes reviewing chart and  face to face time with patient.    Return  if symptoms worsen or fail to improve.    Loraine Grip Mayers, PA-C

## 2022-10-02 NOTE — Patient Instructions (Signed)
To help with your bronchitis, you are going to use a steroid taper, and I sent Tessalon Perles to help you with your cough.  I encourage you to stay very well-hydrated and get plenty of rest.  I hope that you feel better soon, please let us know if there is anything else we can do for you.  Kennieth Rad, PA-C Physician Assistant [facility] [email or web address]   Bronquitis aguda en los adultos Acute Bronchitis, Adult  La bronquitis aguda es la inflamacin repentina de las vas respiratorias principales (bronquios) que salen de la trquea en los pulmones. La hinchazn hace que las vas respiratorias se reduzcan y produzcan ms mucosidad de lo normal. Esto puede dificultar la respiracin y provocar tos o respiracin ruidosa (sibilancia). La bronquitis aguda puede durar [employer]. La tos puede durar ms tiempo. Las [place], el asma y la exposicin al humo pueden empeorar la afeccin. Cules son las causas? Esta afeccin puede ser causada por microbios y por sustancias que irritan los pulmones, por ejemplo: Virus del resfro y de la gripe. La causa ms frecuente de esta afeccin es el virus que provoca el resfro comn. Bacterias. Esto es menos frecuente. Aspirar sustancias que [employer], por ejemplo: Humo de cigarrillos y otros productos de tabaco. Polvo y polen. Vapores de productos de limpieza domsticos, gases o combustible quemado. Contaminacin del aire interior o exterior. Qu incrementa el riesgo? Los siguientes factores pueden hacer que sea ms propenso a Armed forces training and education officer afeccin: Debilitamiento del sistema de defensa del organismo, tambin denominado sistema inmunitario. Una afeccin que afecte a los pulmones y la respiracin, como el asma. Cules son los signos o sntomas? Los sntomas frecuentes de esta afeccin incluyen los siguientes: Tos. Puede acompaarse de mucosidad transparente, amarillenta o  verdosa procedente de los pulmones (esputo). Sibilancias. Secrecin o congestin nasal. Exceso de mucosidad en los pulmones (congestin torcica). Falta de aire. Dolores y [place], incluido dolor de garganta o de pecho. Cmo se diagnostica? Esta afeccin, usualmente, se diagnostica en funcin de lo siguiente: Los sntomas y los antecedentes mdicos. Un examen fsico. Tambin pueden realizarle otros estudios, incluidas pruebas para descartar otras afecciones, como la neumona. Estos estudios incluyen: Una prueba de funcin pulmonar. Anlisis de [place] de mucosidad para detectar la presencia de bacterias. Pruebas para Advice worker de oxgeno en la sangre. Anlisis de [place]. Radiografa de trax. Cmo se trata? La [employer] de los casos de bronquitis aguda se recupera con el [place], sin tratamiento. El mdico puede recomendarle lo siguiente: Beber ms lquidos para ayudar a diluir la mucosidad de modo que sea ms fcil expectorarla. Usar medicamentos inhalados Development worker, international aid) para mejorar el flujo de aire que entra y Audiological scientist de los pulmones. Usar un vaporizador o Data processing manager. Se trata de aparatos que aportan humedad al ambiente para ayudar a Fish farm manager. Tomar un medicamento que diluye la mucosidad y [place] la congestin (expectorante). Tomar un medicamento que previene o detiene la tos (antitusivo). No esfrecuente tomar un antibitico para esta afeccin. Siga estas indicaciones en su casa:  Use los medicamentos de venta libre y los recetados solamente como se lo haya indicado el mdico. Utilice un Tax inspector, un vaporizador o un humidificador, como se lo haya indicado el mdico. [employer] cucharaditas (10 ml) de miel a la hora de acostarse para disminuir la tos por la noche. Beba suficiente lquido como para Theatre manager la orina de color amarillo plido. No consuma ningn producto que contenga nicotina o tabaco. Estos productos incluyen cigarrillos, tabaco para  mascar y aparatos  de vapeo, como los Psychologist, sport and exercise. Si necesita ayuda para dejar de consumir estos productos, consulte al [employer]. Descanse mucho. Retome sus actividades normales segn lo indicado por el mdico. Pregntele al mdico qu actividades son seguras para usted. Concurra a [place]. Esto es importante. Cmo se previene? Para disminuir el riesgo de volver a sufrir esta afeccin: Lvese las manos frecuentemente con agua y jabn durante al menos 20 segundos. Use desinfectante para manos si no dispone de [place] y [place]. Evite el contacto con personas que tienen sntomas de resfro. Trate de no llevarse las manos a la boca, la nariz o los ojos. Evite inhalar humo o vapores qumicos. La inhalacin de humo o vapores qumicos har que su afeccin empeore. Aplquese la vacuna antigripal todos los [place]. Comunquese con un mdico si: Los sntomas no mejoran despus de 2 semanas. Tiene dificultad para expulsar la mucosidad al toser. La tos lo mantiene despierto por la noche. Tiene fiebre. Solicite ayuda de inmediato si: Tose con sangre. Siente dolor en el pecho. Tiene falta de aire grave. Se desmaya o se siente como si se fuera a desmayar. Tiene un dolor de cabeza intenso. Tiene fiebre o escalofros que empeoran. Estos sntomas pueden representar un problema grave que constituye Engineer, maintenance (IT). No espere a ver si los sntomas desaparecen. Solicite atencin mdica de inmediato. Comunquese con el servicio de emergencias de su localidad (911 en los [place]). No conduzca por sus propios medios [employer]. Resumen La bronquitis aguda es la inflamacin de las vas respiratorias principales (bronquios) que salen de la trquea en los pulmones. La hinchazn hace que las vas respiratorias se reduzcan y produzcan ms mucosidad de lo normal. Beber ms lquidos puede ayudar a diluir la mucosidad de modo que sea ms fcil expectorarla. Use los medicamentos de venta libre y  los recetados solamente como se lo haya indicado el mdico. No consuma ningn producto que contenga nicotina o tabaco. Estos productos incluyen cigarrillos, tabaco para Higher education careers adviser y aparatos de vapeo, como los Psychologist, sport and exercise. Si necesita ayuda para dejar de consumir estos productos, consulte al mdico. Comunquese con un mdico si los sntomas no mejoran despus de 2 semanas. Esta informacin no tiene Marine scientist el consejo del mdico. Asegrese de hacerle al mdico cualquier pregunta que tenga. Document Revised: 12/13/2020 Document Reviewed: 12/13/2020 Elsevier Patient Education  [place].

## 2022-10-03 ENCOUNTER — Other Ambulatory Visit: Payer: Self-pay

## 2022-12-21 ENCOUNTER — Other Ambulatory Visit: Payer: Self-pay

## 2022-12-21 ENCOUNTER — Ambulatory Visit: Payer: Self-pay | Attending: Physician Assistant | Admitting: Physician Assistant

## 2022-12-21 ENCOUNTER — Encounter: Payer: Self-pay | Admitting: Physician Assistant

## 2022-12-21 VITALS — BP 128/78 | HR 57 | Wt 205.4 lb

## 2022-12-21 DIAGNOSIS — R7303 Prediabetes: Secondary | ICD-10-CM

## 2022-12-21 DIAGNOSIS — R7989 Other specified abnormal findings of blood chemistry: Secondary | ICD-10-CM

## 2022-12-21 DIAGNOSIS — Z758 Other problems related to medical facilities and other health care: Secondary | ICD-10-CM

## 2022-12-21 DIAGNOSIS — M79601 Pain in right arm: Secondary | ICD-10-CM

## 2022-12-21 DIAGNOSIS — R609 Edema, unspecified: Secondary | ICD-10-CM

## 2022-12-21 DIAGNOSIS — Z603 Acculturation difficulty: Secondary | ICD-10-CM

## 2022-12-21 DIAGNOSIS — D1721 Benign lipomatous neoplasm of skin and subcutaneous tissue of right arm: Secondary | ICD-10-CM

## 2022-12-21 DIAGNOSIS — M545 Low back pain, unspecified: Secondary | ICD-10-CM

## 2022-12-21 MED ORDER — METHOCARBAMOL 500 MG PO TABS
500.0000 mg | ORAL_TABLET | Freq: Four times a day (QID) | ORAL | 1 refills | Status: DC
  Filled 2022-12-21: qty 60, 15d supply, fill #0

## 2022-12-21 MED ORDER — NAPROXEN 500 MG PO TABS
500.0000 mg | ORAL_TABLET | Freq: Two times a day (BID) | ORAL | 1 refills | Status: DC
  Filled 2022-12-21: qty 60, 30d supply, fill #0

## 2022-12-21 NOTE — Patient Instructions (Addendum)
Trabaja con el objetivo de eliminar los azcares y los alimentos ricos en almidn de tu dieta. Beba entre 80 y 100 onzas de agua al da   Edema Edema  Un edema se produce cuando hay mucho lquido en el cuerpo o debajo de la piel. Un edema puede hacer que las piernas, los pies y los tobillos se hinchen. La hinchazn suele ocurrir en tejidos ms sueltos, como alrededor [employer]. Esta es una afeccin frecuente. Es ms frecuente a medida que una persona envejece. Hay muchas causas posibles de edema. Estos incluyen: Consumir demasiada sal (sodio). Estar de pie o sentado durante mucho tiempo. Afecciones mdicas tales como: Embarazo. Insuficiencia cardaca. Enfermedad heptica. Enfermedad renal. Cncer. Cuando hace calor, el edema puede empeorar. Generalmente, el edema es indoloro. La piel puede parecer hinchada o tener un aspecto brilloso. Siga estas instrucciones en su casa: Medicamentos Use los medicamentos de venta libre y los recetados solamente como se lo haya indicado el mdico. El mdico puede recetarle un medicamento para ayudar a que el cuerpo elimine el exceso de agua (diurtico). Tome este medicamento si se lo indican. Comida y bebida Lleve una dieta con bajo contenido de sal (baja en sodio) como se lo haya indicado el mdico. A veces, disminuir el consumo de sal puede reducir la hinchazn. Segn la causa de su hinchazn, es posible que deba limitar la cantidad de lquido que bebe (restriccin de lquido). Instrucciones generales Cuando est sentado o acostado, mantenga la zona lesionada por encima del nivel del corazn. No se quede quieto ni permanezca de pie durante [employer]. No use ropa ajustada. No use ligas en la parte superior de las piernas. Ejercite las piernas. Esto ayuda a Building services engineer. Use medias de compresin como se lo haya indicado el mdico. Es importante que sean del tamao correcto. Estas medias deben ser prescritas por su mdico para evitar posibles  lesiones. Si le recomiendan vendajes, selos como se lo haya indicado el mdico. Comunquese con un mdico si: El tratamiento no funciona. Tiene enfermedades cardacas, hepticas o renales, y observa sntomas de edema. Aumenta de peso de [place] repentina y sin motivo aparente. Solicite ayuda de inmediato si: Le falta el aire o le duele el pecho. No puede respirar cuando se acuesta. Tiene dolor, enrojecimiento o calor en las zonas hinchadas. Tiene una enfermedad cardaca, heptica o renal, y se le forma un edema de manera repentina. Tiene fiebre y los sntomas empeoran de manera repentina. Estos sntomas pueden Customer service manager. Solicite ayuda de inmediato. Llame al 911. No espere a ver si los sntomas desaparecen. No conduzca por sus propios medios [employer] hospital. Resumen Un edema se produce cuando hay mucho lquido en el cuerpo o debajo de la piel. Un edema puede hacer que las piernas, los pies y los tobillos se hinchen. La hinchazn suele ocurrir en tejidos ms sueltos, como alrededor [employer] Omaha. Cuando est sentado o acostado, mantenga la zona lesionada por encima del nivel del corazn. Siga las instrucciones del mdico con respecto a la dieta y a la cantidad de lquido que puede beber. Esta informacin no tiene Theme park manager el consejo del mdico. Asegrese de hacerle al mdico cualquier pregunta que tenga. Document Revised: 04/18/2021 Document Reviewed: 04/18/2021 Elsevier Patient Education  2023 [employer].

## 2022-12-21 NOTE — Progress Notes (Signed)
Patient ID: Melanie Newman, female   DOB: Nov 29, 1985, 37 y.o.   MRN: [id number]    Melanie Newman, is a 37 y.o. female  [id number]  [id number]  DOB - Aug 31, 1985  Chief Complaint  Patient presents with   mass armpit    Back Pain   Numbness    In right arm        Subjective:   Melanie Newman is a 37 y.o. female here today for multiple concerns.  R arm pain that is intermittent.  She is R hand dominant.  It seems worse after a lot of cleaning.    Also c/o lower back pain for about 1 year.  Worse on the L lower back.  No radiating pain.  No numbness or weakness.  No urinary s/sx.  NKI.  She has used some OTC hot and cold patches.    Fatty growth under R axilla.  She feels it is getting bigger and it is bothering her.    Also c/o leg swelling at the end of the day.  Does not exercise.  Does not follow any type of eating plan   No problems updated.  ALLERGIES: No Known Allergies  PAST MEDICAL HISTORY: Past Medical History:  Diagnosis Date   Anxiety    Depression    mild pp depression after 1st pregnancy   Gastritis    GERD (gastroesophageal reflux disease)    Heart murmur    maybe in the past but many years ago   UTI (lower urinary tract infection)     MEDICATIONS AT HOME: Prior to Admission medications   Medication Sig Start Date End Date Taking? Authorizing Provider  methocarbamol (ROBAXIN) 500 MG tablet Take 1 tablet (500 mg total) by mouth 4 (four) times daily. 12/21/22  Yes Georgian Co M, PA-C  naproxen (NAPROSYN) 500 MG tablet Take 1 tablet (500 mg total) by mouth 2 (two) times daily with a meal. Prn pain 12/21/22  Yes Zayda Angell M, PA-C  benzonatate (TESSALON) 100 MG capsule Take 1 to 2 capsules by mouth 3 times daily as needed. Patient not taking: Reported on 12/21/2022 10/02/22   Mayers, Cari S, PA-C  cyclobenzaprine (FLEXERIL) 5 MG tablet Take 1 tablet (5 mg total) by mouth 3 (three) times daily as needed for muscle  spasms. Patient not taking: Reported on 11/30/2021 08/16/21   Charlynne Pander, MD  fluticasone [facility]) 50 MCG/ACT nasal spray Place 2 sprays into both nostrils daily. Patient not taking: Reported on 11/30/2021 04/20/21   Anders Simmonds, PA-C  omeprazole (PRILOSEC) 20 MG capsule Take 2 capsules (40 mg total) by mouth daily. Patient not taking: Reported on 11/30/2021 10/25/21   Claiborne Rigg, NP  omeprazole (PRILOSEC) 40 MG capsule Take 1 capsule by mouth once daily Patient not taking: Reported on 10/02/2022 10/26/21   Napoleon Form, MD  ondansetron (ZOFRAN-ODT) 8 MG disintegrating tablet Take 1 tablet (8 mg total) by mouth every 8 (eight) hours as needed for nausea or vomiting. Patient not taking: Reported on 03/02/2022 11/30/21   Mayers, Kasandra Knudsen, PA-C  paragard intrauterine copper IUD IUD 1 each by Intrauterine route once. Patient not taking: Reported on 03/02/2022    [provider]  sucralfate (CARAFATE) 1 g tablet Take 1 tablet (1 g total) by mouth 4 (four) times daily -  with meals and at bedtime. Patient not taking: Reported on 10/02/2022 03/02/22   [place], Amy S, PA-C    ROS: Neg HEENT Neg resp Neg cardiac Neg GI  Neg GU Neg psych Neg neuro  Objective:   Vitals:   12/21/22 1558  BP: 128/78  Pulse: (!) 57  SpO2: 96%  Weight: 205 lb 6.4 oz (93.2 kg)   Exam General appearance : Awake, alert, not in any distress. Speech Clear. Not toxic looking HEENT: Atraumatic and Normocephalic Neck: Supple, no JVD. No cervical lymphadenopathy.  Chest: Good air entry bilaterally, CTAB.  No rales/rhonchi/wheezing CVS: S1 S2 regular, no murmurs.  B breasts w/o discreet mass or abnormality,  ?lipoma R axilla is fuller than L but no LN Extremities: B/L Lower Ext shows no edema, both legs are warm to touch Neurology: Awake alert, and oriented X 3, CN II-XII intact, Non focal Skin: No Rash  Data Review Lab Results  Component Value Date   HGBA1C 5.8 (H) 04/20/2021   HGBA1C  5.1 03/29/2018    Assessment & Plan   1. Edema, unspecified type Increase activity.  Decrease sugar and salt intake - Comprehensive metabolic panel  2. Lipoma of right axilla - Ambulatory referral to General Surgery  3. Right arm pain Likely musculoskeletal - naproxen (NAPROSYN) 500 MG tablet; Take 1 tablet (500 mg total) by mouth 2 (two) times daily with a meal. Prn pain  Dispense: 60 tablet; Refill: 1 - methocarbamol (ROBAXIN) 500 MG tablet; Take 1 tablet (500 mg total) by mouth 4 (four) times daily.  Dispense: 60 tablet; Refill: 1  4. Language barrier AMN interpreters used and additional time performing visit was required.   5. Left-sided low back pain without sciatica, unspecified chronicity - naproxen (NAPROSYN) 500 MG tablet; Take 1 tablet (500 mg total) by mouth 2 (two) times daily with a meal. Prn pain  Dispense: 60 tablet; Refill: 1 - methocarbamol (ROBAXIN) 500 MG tablet; Take 1 tablet (500 mg total) by mouth 4 (four) times daily.  Dispense: 60 tablet; Refill: 1  6. Elevated LFTs  7. Prediabetes I have had a lengthy discussion and provided education about insulin resistance and the intake of too much sugar/refined carbohydrates.  I have advised the patient to work at a goal of eliminating sugary drinks, candy, desserts, sweets, refined sugars, processed foods, and white carbohydrates.  The patient expresses understanding.   - Hemoglobin A1c    Return in about 3 months (around 03/23/2023) for PCP for chronic conditions.  The patient was given clear instructions to go to ER or return to medical center if symptoms don't improve, worsen or new problems develop. The patient verbalized understanding. The patient was told to call to get lab results if they haven't heard anything in the next week.      Georgian Co, PA-C [facility] [place], [place] [phone]   12/21/2022, 4:30 PM

## 2022-12-22 LAB — COMPREHENSIVE METABOLIC PANEL
ALT: 44 IU/L — ABNORMAL HIGH (ref 0–32)
AST: 23 IU/L (ref 0–40)
Albumin/Globulin Ratio: 1.7 (ref 1.2–2.2)
Albumin: 4.3 g/dL (ref 3.9–4.9)
Alkaline Phosphatase: 65 IU/L (ref 44–121)
BUN/Creatinine Ratio: 17 (ref 9–23)
BUN: 16 mg/dL (ref 6–20)
Bilirubin Total: 0.9 mg/dL (ref 0.0–1.2)
CO2: 23 mmol/L (ref 20–29)
Calcium: 9.2 mg/dL (ref 8.7–10.2)
Chloride: 104 mmol/L (ref 96–106)
Creatinine, Ser: 0.92 mg/dL (ref 0.57–1.00)
Globulin, Total: 2.5 g/dL (ref 1.5–4.5)
Glucose: 93 mg/dL (ref 70–99)
Potassium: 4.1 mmol/L (ref 3.5–5.2)
Sodium: 140 mmol/L (ref 134–144)
Total Protein: 6.8 g/dL (ref 6.0–8.5)
eGFR: 83 mL/min/{1.73_m2} (ref >59)

## 2022-12-22 LAB — HEMOGLOBIN A1C
Est. average glucose Bld gHb Est-mCnc: 114 mg/dL
Hgb A1c MFr Bld: 5.6 % (ref 4.8–5.6)

## 2023-01-03 ENCOUNTER — Ambulatory Visit: Payer: Self-pay | Admitting: *Deleted

## 2023-01-03 NOTE — Telephone Encounter (Signed)
Pt given lab results per notes of Georgian Co, PA-C on 12/22/2022 at 11:24 AM.   Pt verbalized understanding.   [facility] [phone]

## 2023-01-03 NOTE — Telephone Encounter (Signed)
Reason for Disposition  [1] Follow-up call to recent contact AND [2] information only call, no triage required  Answer Assessment - Initial Assessment Questions 1. REASON FOR CALL or QUESTION: "What is your reason for calling today?" or "How can I best help you?" or "What question do you have that I can help answer?"     Calling in for lab results  Protocols used: Information Only Call - No Triage-A-AH

## 2023-01-05 ENCOUNTER — Encounter: Payer: Self-pay | Admitting: *Deleted

## 2023-05-28 IMAGING — CT CT RENAL STONE PROTOCOL
2 of 4 series · 16 of 46 positions shown, 18 images · non-contrast
Comparison: CT dated 05/14/2016.

CLINICAL DATA: Left flank pain.  History of kidney stones.



[Series 504: axial st · axial · 0.83mm/px · z∈[+958,+1433]mm · 13 of 107 slices shown, 15 images]
[im 6/107  soft-tissue]
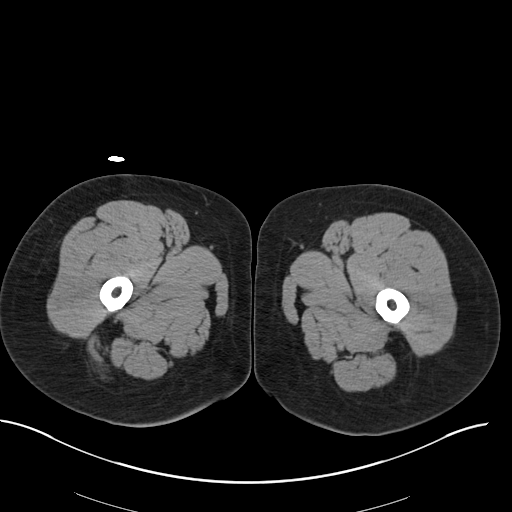
[im 6/107  bone]
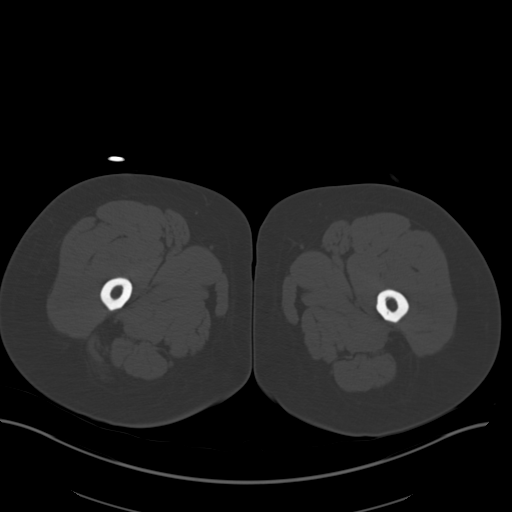
[im 12/107  soft-tissue]
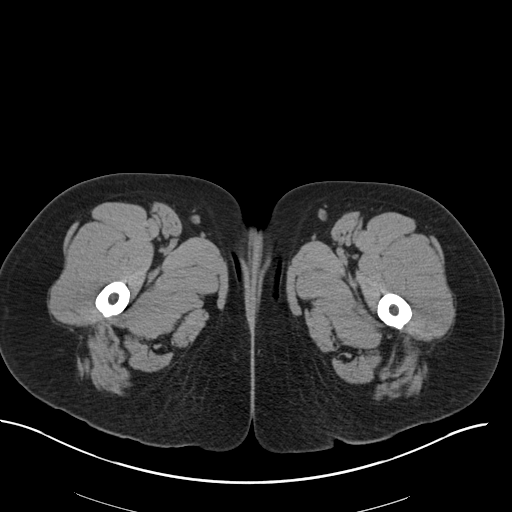
[im 24/107  soft-tissue]
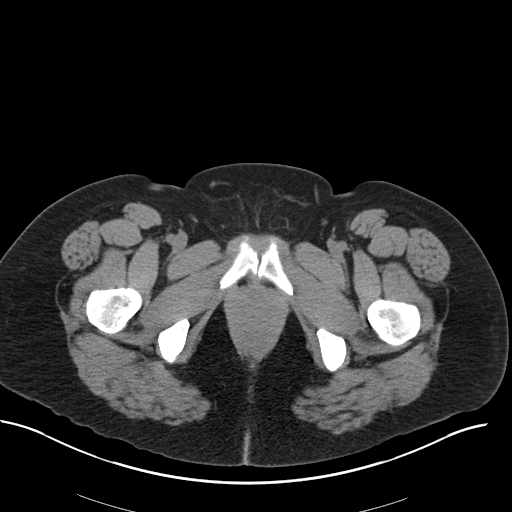
[im 30/107  soft-tissue]
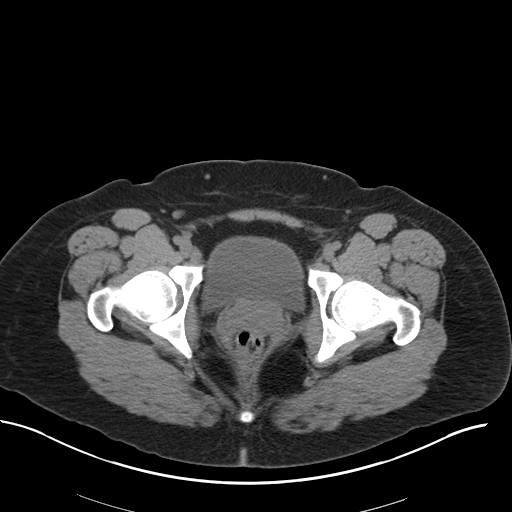
[im 36/107  soft-tissue]
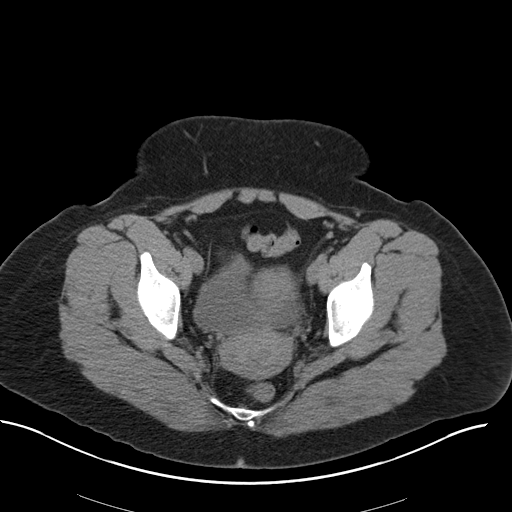
[im 48/107  soft-tissue]
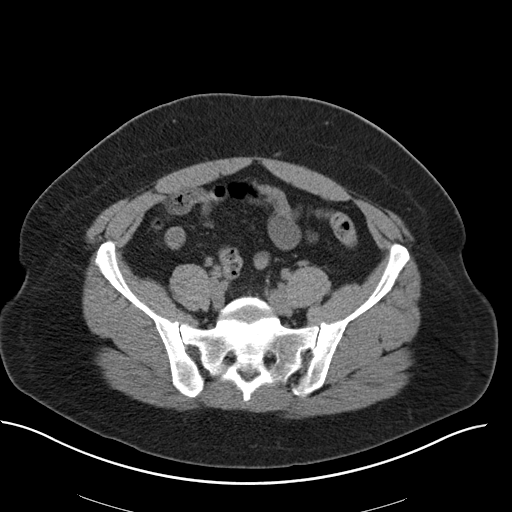
[im 54/107  soft-tissue]
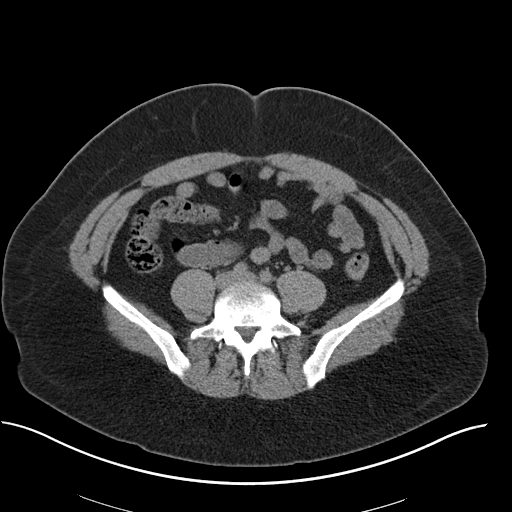
[im 59/107  soft-tissue]
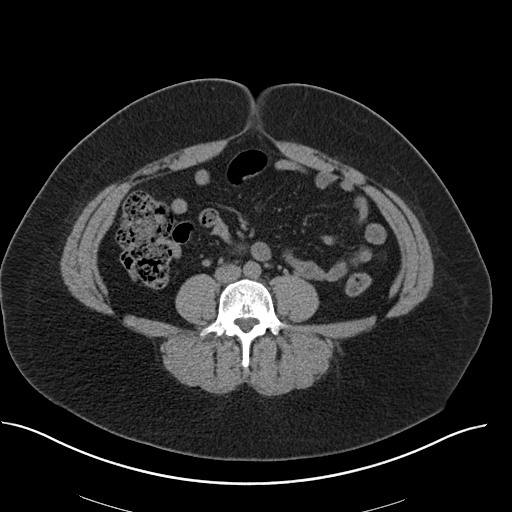
[im 71/107  soft-tissue]
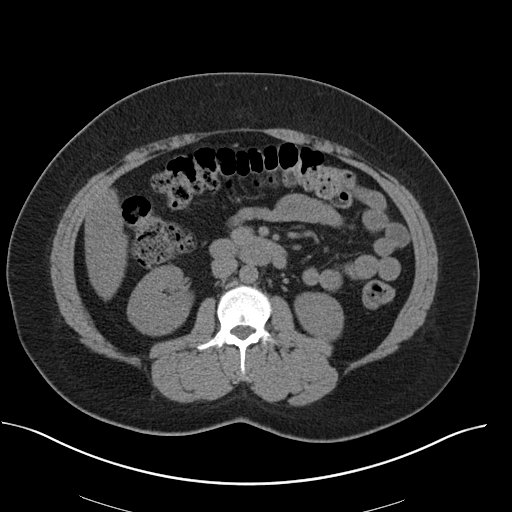
[im 71/107  bone]
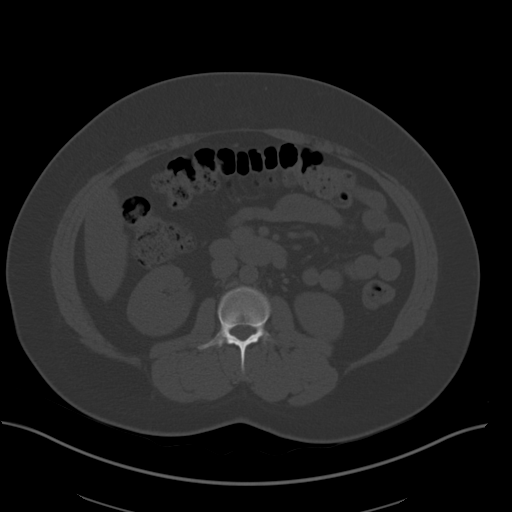
[im 77/107  soft-tissue]
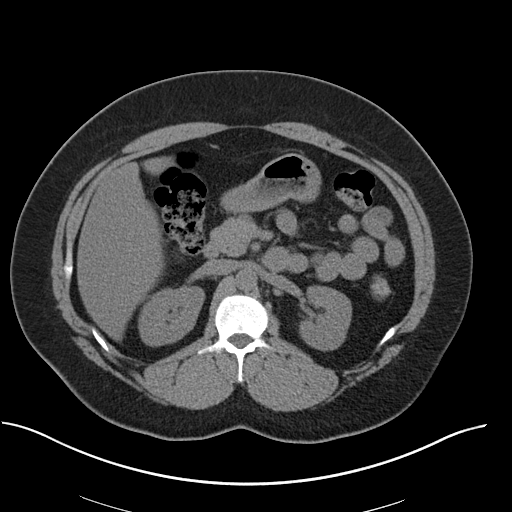
[im 83/107  soft-tissue]
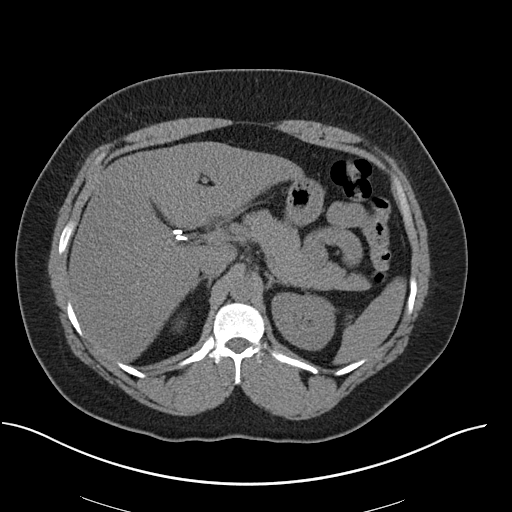
[im 95/107  soft-tissue]
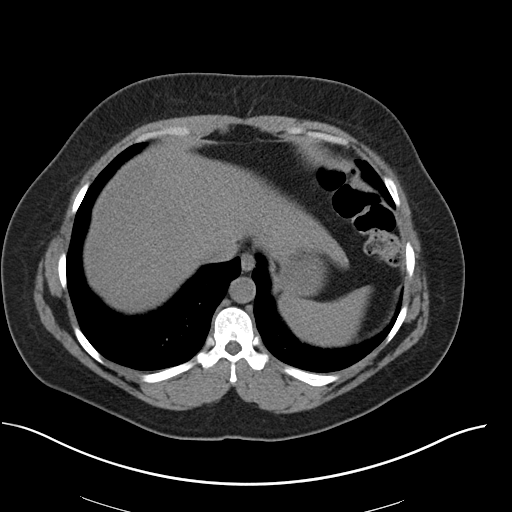
[im 101/107  soft-tissue]
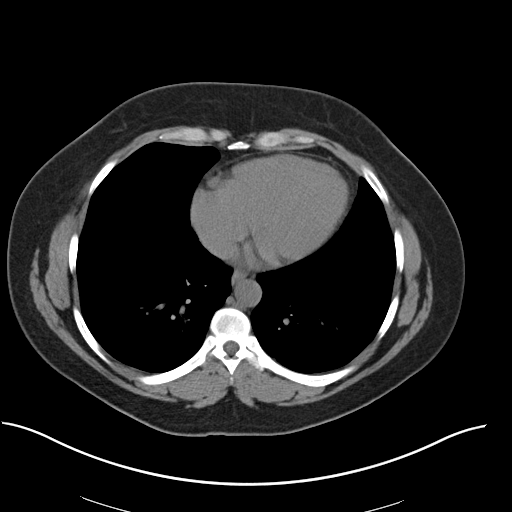

[Series 506: coronal · coronal · 0.90mm/px · 3 of 147 slices shown]
[im 49/147  soft-tissue]
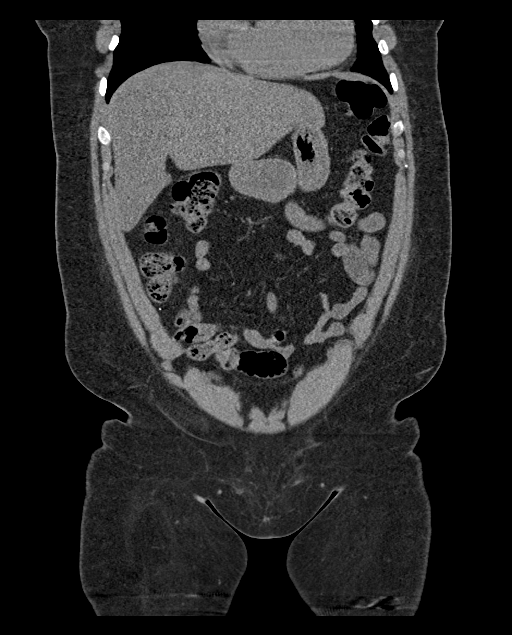
[im 65/147  soft-tissue]
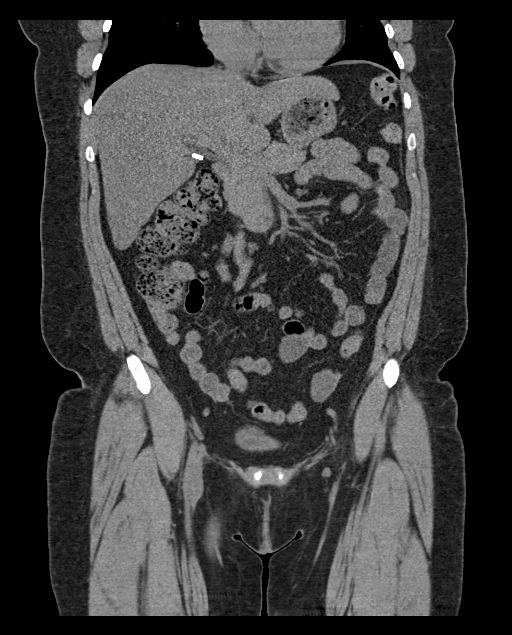
[im 82/147  soft-tissue]
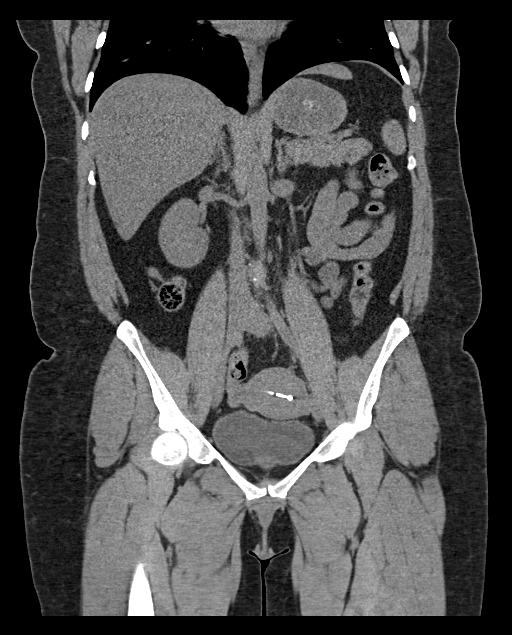

[16 of 46 positions shown; findings below may reference images not displayed]

FINDINGS: Evaluation of this exam is limited in the absence of intravenous
contrast.

Lower chest: The visualized lung bases are clear.

No intra-abdominal free air or free fluid.

Hepatobiliary: Fatty liver. No intrahepatic biliary dilatation.
Cholecystectomy.

Pancreas: Unremarkable. No pancreatic ductal dilatation or
surrounding inflammatory changes.

Spleen: Normal in size without focal abnormality.

Adrenals/Urinary Tract: The adrenal glands are unremarkable. The
kidneys, visualized ureters, and urinary bladder appear
unremarkable.

Stomach/Bowel: There is no bowel obstruction or active inflammation.
The appendix is normal.

Vascular/Lymphatic: The abdominal aorta and IVC are unremarkable on
this noncontrast CT. No portal venous gas. There is no adenopathy.

Reproductive: The uterus is anteverted. An intrauterine device is
noted which appears in appropriate positioning. No adnexal masses.

Other: Small fat containing umbilical hernia.

Musculoskeletal: No acute or significant osseous findings.
IMPRESSION: 1. No acute intra-abdominal or pelvic pathology. No hydronephrosis
or nephrolithiasis.
2. Fatty liver.

## 2023-05-28 IMAGING — CR DG LUMBAR SPINE COMPLETE 4+V
5 series · 5 of 5 positions shown · non-contrast
Comparison: 03/14/2008

CLINICAL DATA: Back pain

EXAM:
LUMBAR SPINE - COMPLETE 4+ VIEW

[t lumbar spine ap]
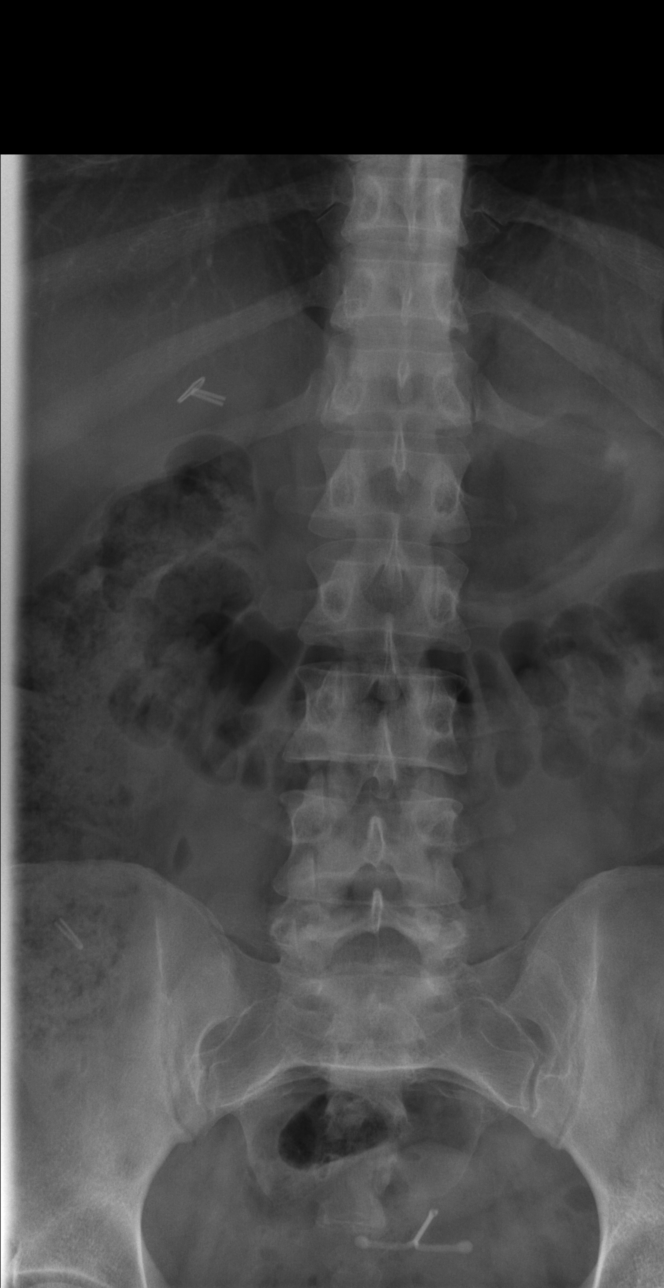

[t lumbar spine obl (1 of 2)]
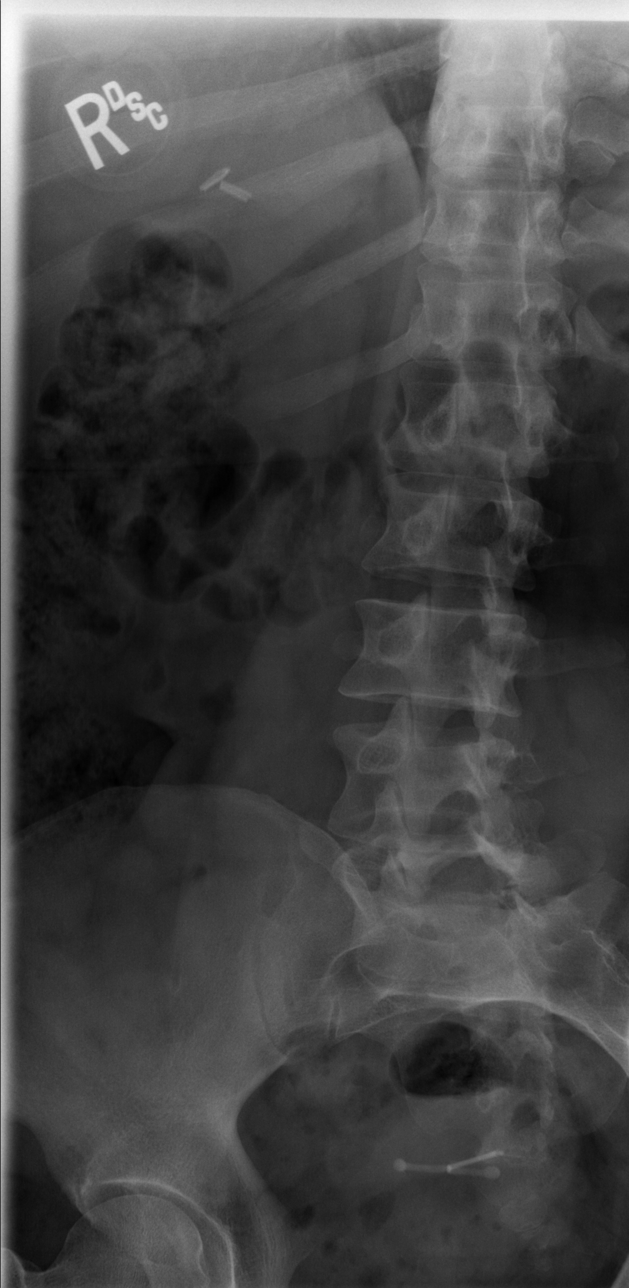

[t lumbar spine obl (2 of 2)]
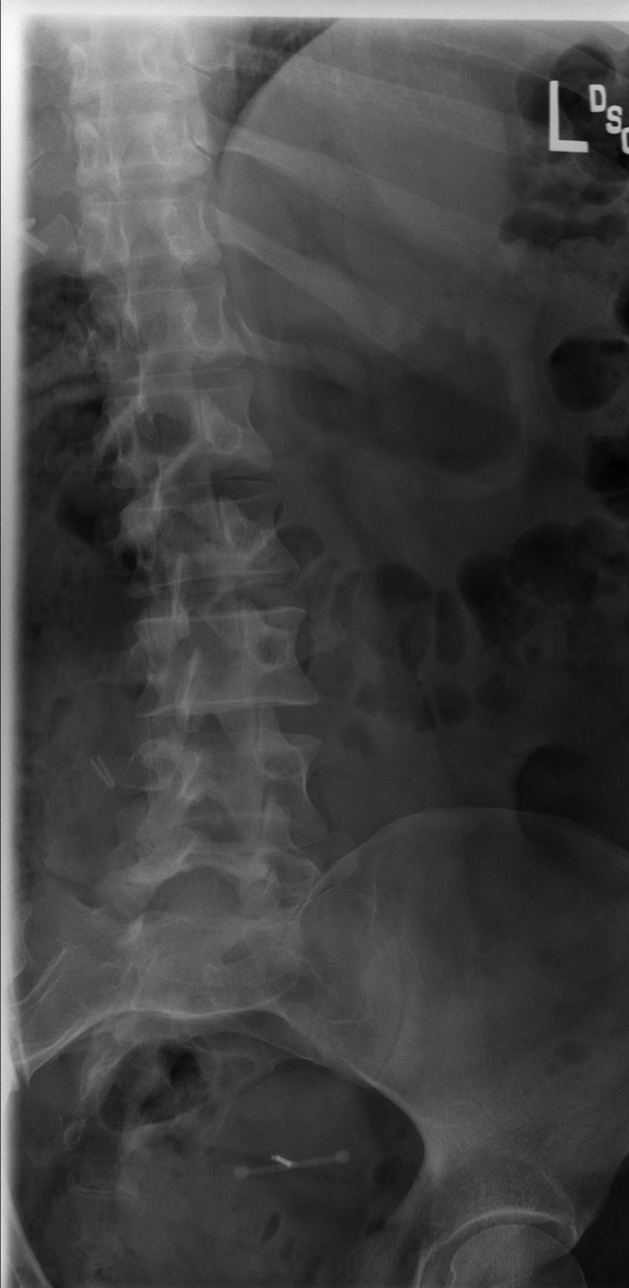

[t lumbar spine lat]
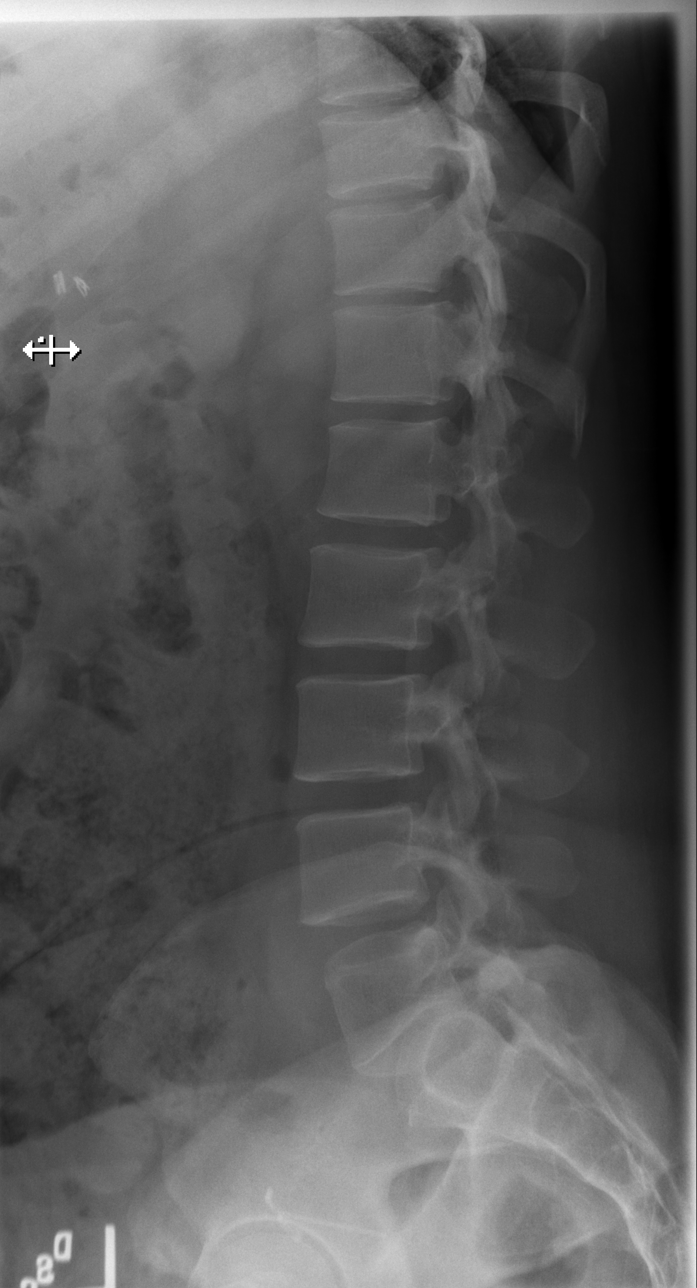

[t lumbar l-5 s-1 spot]
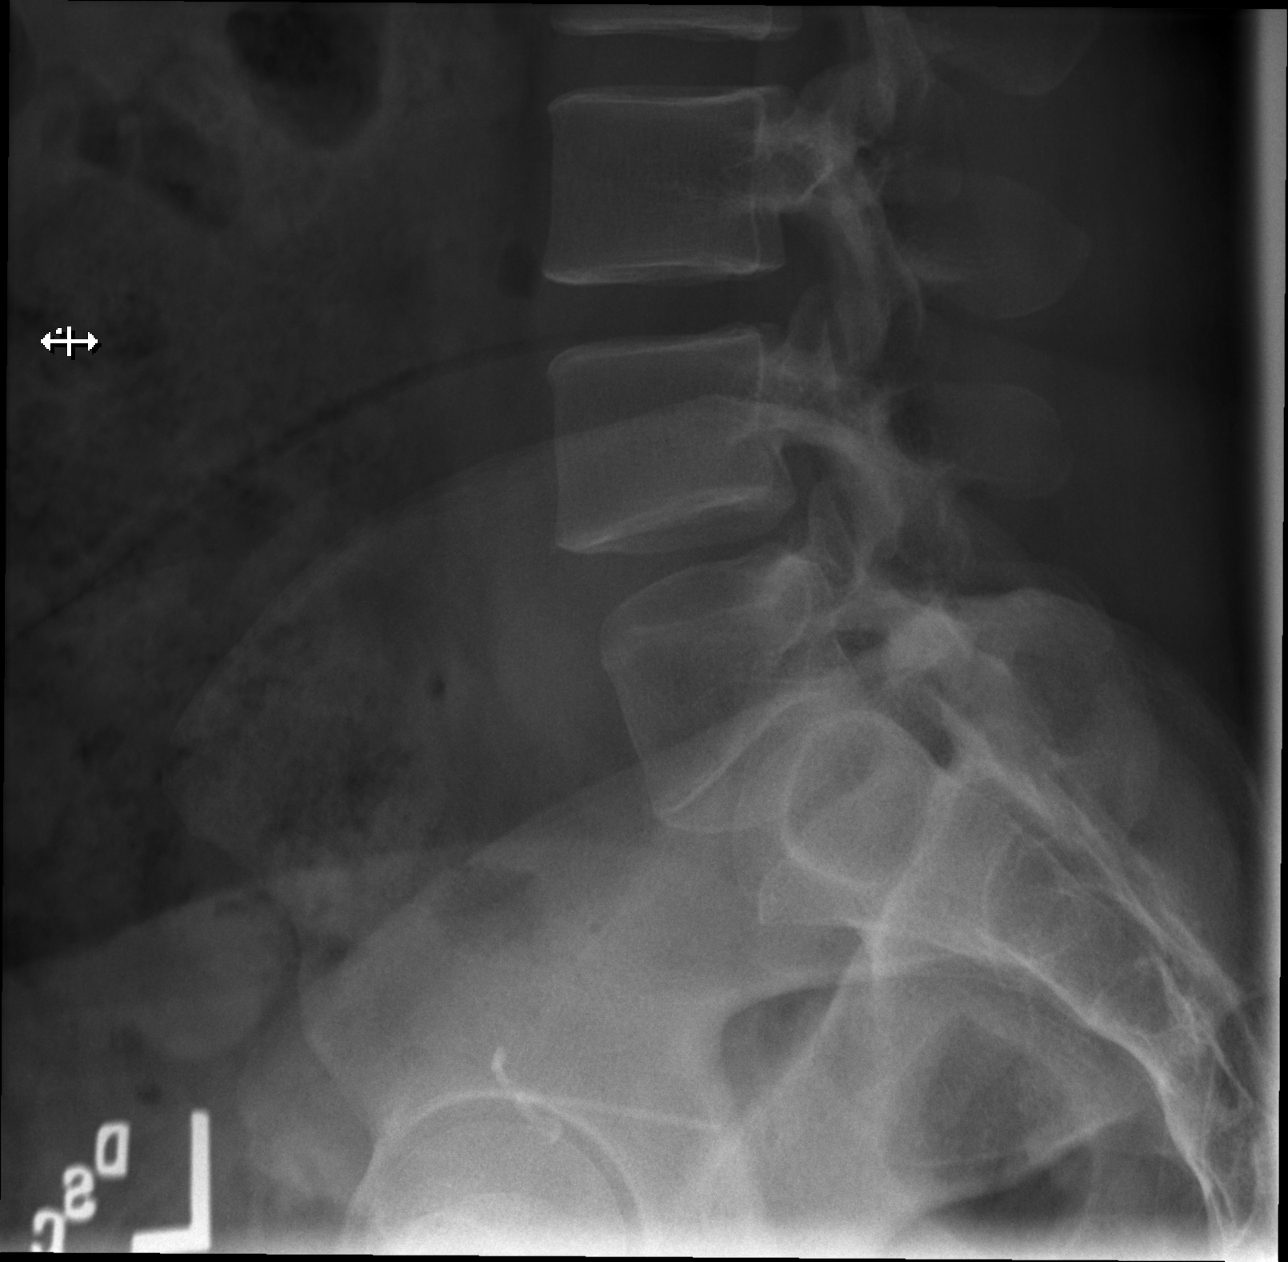

[5 of 5 positions shown; findings below may reference images not displayed]

FINDINGS: No recent fracture is seen. There is no significant disc space
narrowing. Alignment of posterior margins of vertebral bodies is
unremarkable. Degenerative changes are noted in the facet joints at
L5-S1 level. Surgical clips are seen in the right upper quadrant and
right lower quadrant. IUD is seen in the pelvis.
IMPRESSION: No recent fracture is seen. There is no significant disc space
narrowing. Degenerative changes are noted in facet joints in the
lower lumbar spine.

## 2023-05-28 IMAGING — CT CT L SPINE W/O CM
3 series · 16 of 33 positions shown, 19 images · non-contrast
Comparison: Lumbar spine radiographs 08/16/2021. CT abdomen and
pelvis 05/14/2016.

CLINICAL DATA: Left-sided back pain with weakness and numbness in
the left leg.



[Series 4: l spine axial st · axial · 0.43mm/px · z∈[+1119,+1361]mm · 8 of 143 slices shown, 10 images]
[im 11/143  soft-tissue]
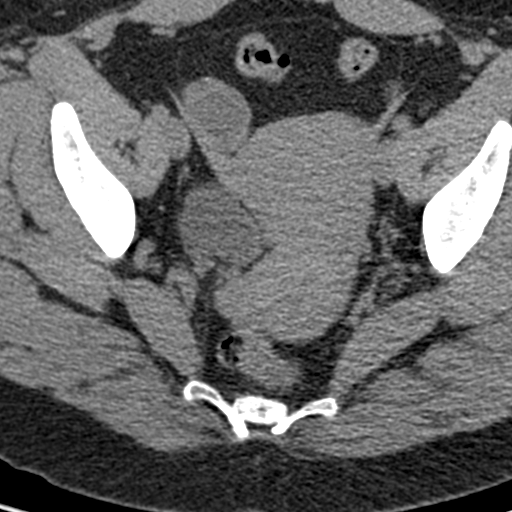
[im 11/143  bone]
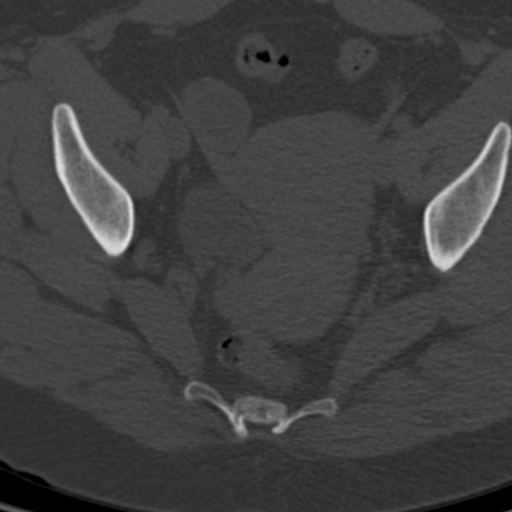
[im 33/143  bone]
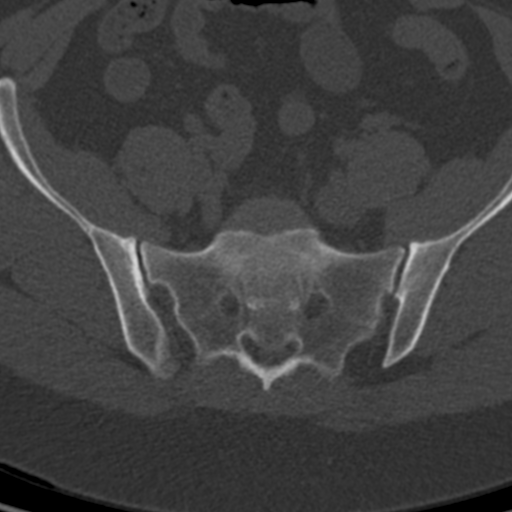
[im 44/143  bone]
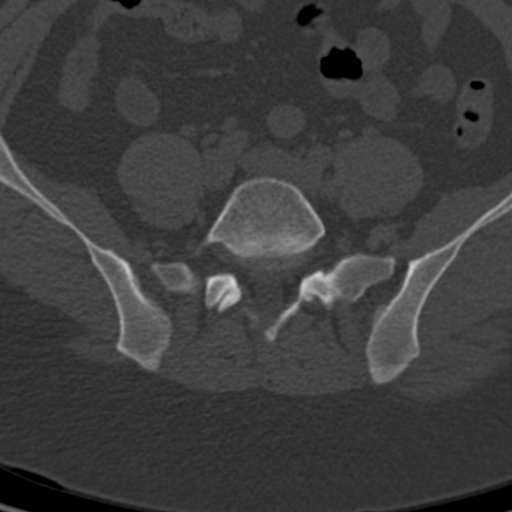
[im 66/143  bone]
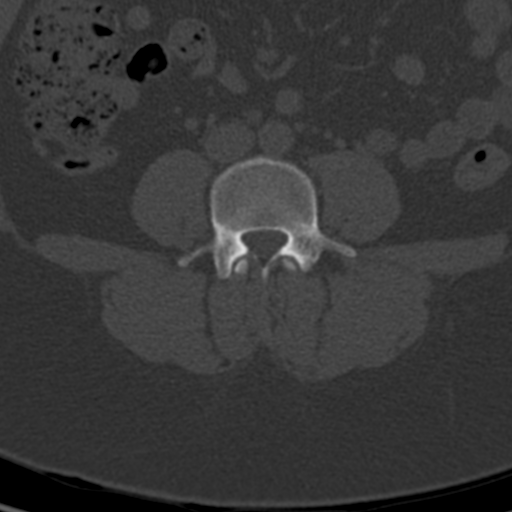
[im 77/143  soft-tissue]
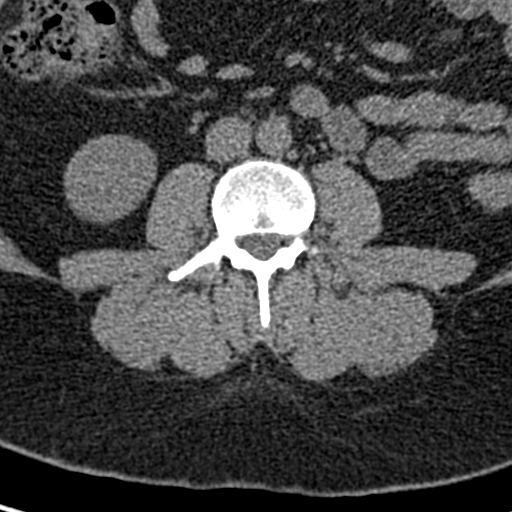
[im 77/143  bone]
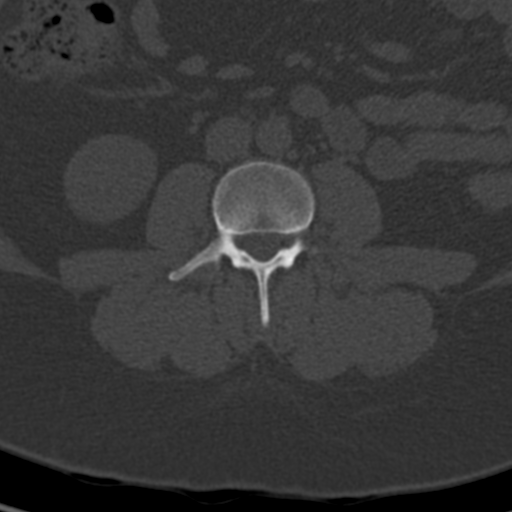
[im 99/143  bone]
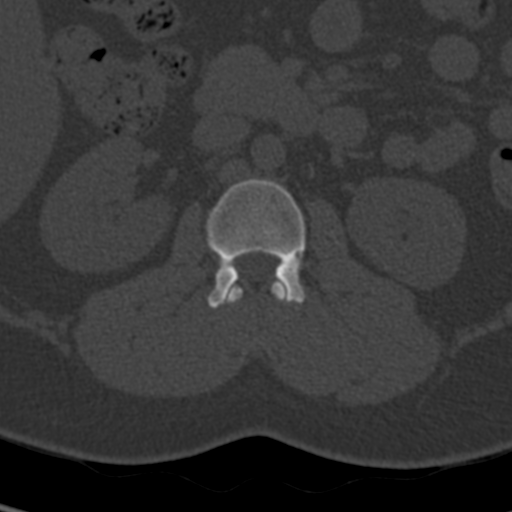
[im 110/143  bone]
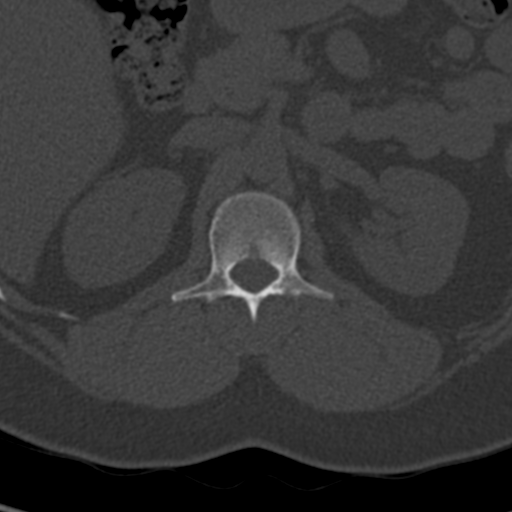
[im 132/143  bone]
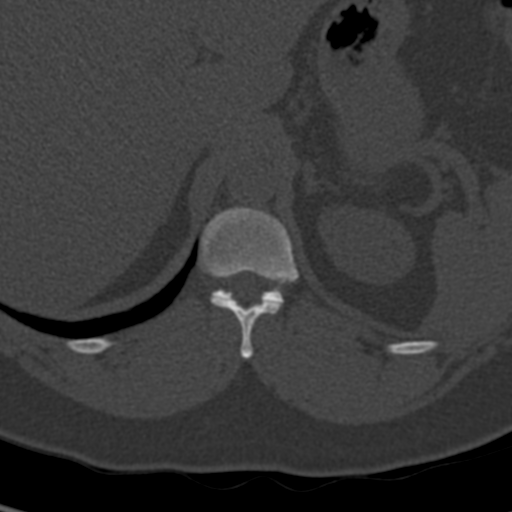

[Series 602: cor st · coronal · 0.56mm/px · 3 of 91 slices shown]
[im 19/91  bone]
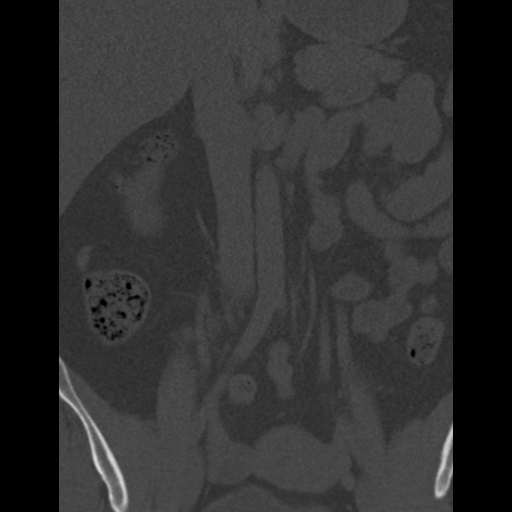
[im 37/91  bone]
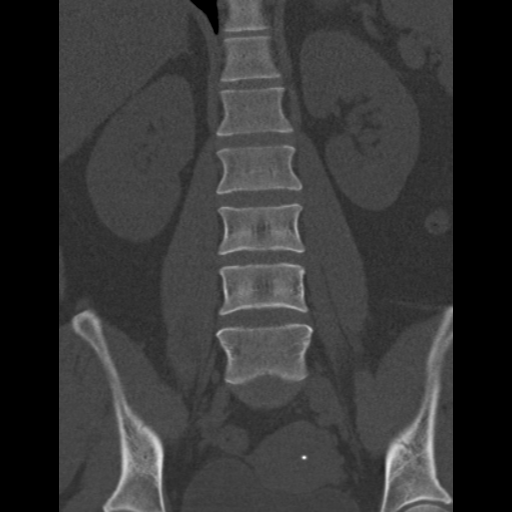
[im 55/91  bone]
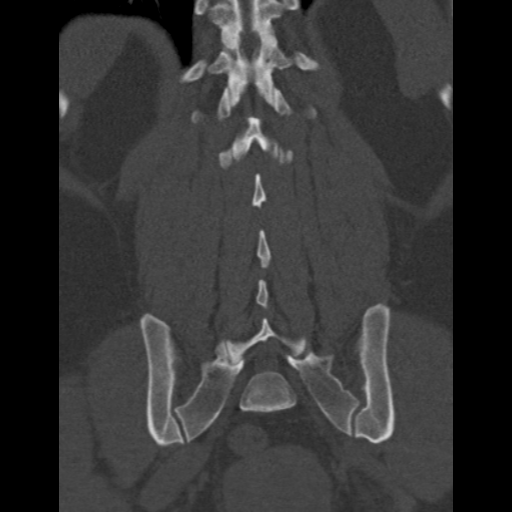

[Series 603: sag st · sagittal · 0.56mm/px · 5 of 60 slices shown, 6 images]
[im 20/60  bone]
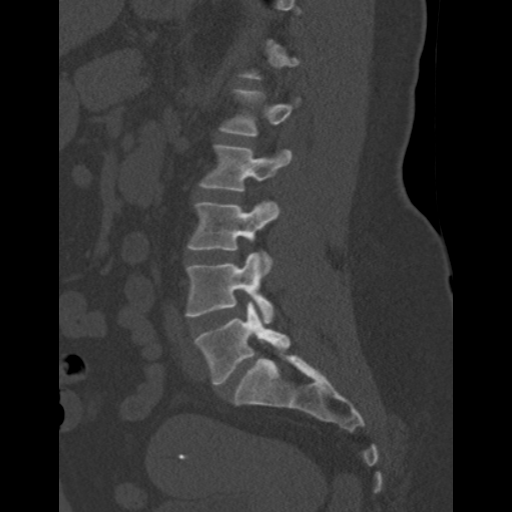
[im 25/60  bone]
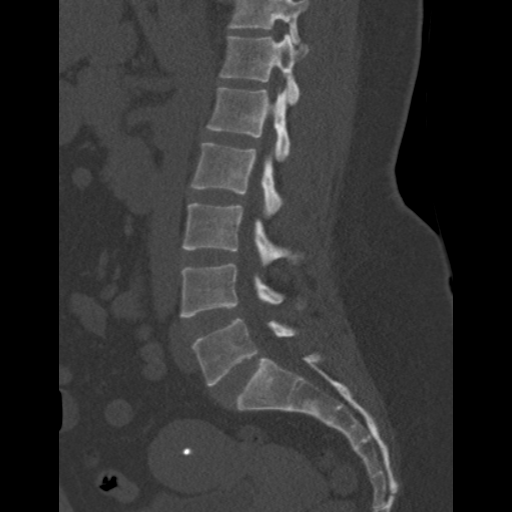
[im 30/60  soft-tissue]
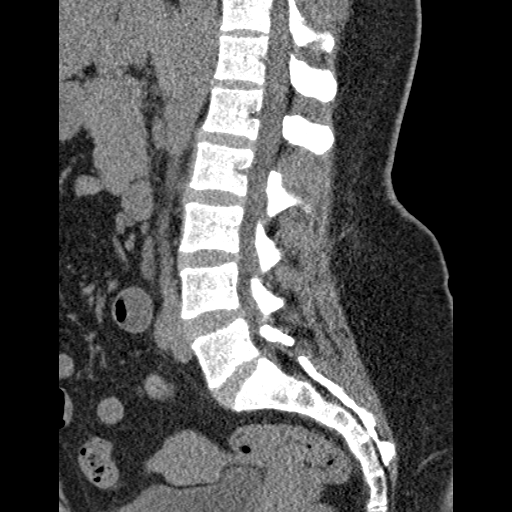
[im 30/60  bone]
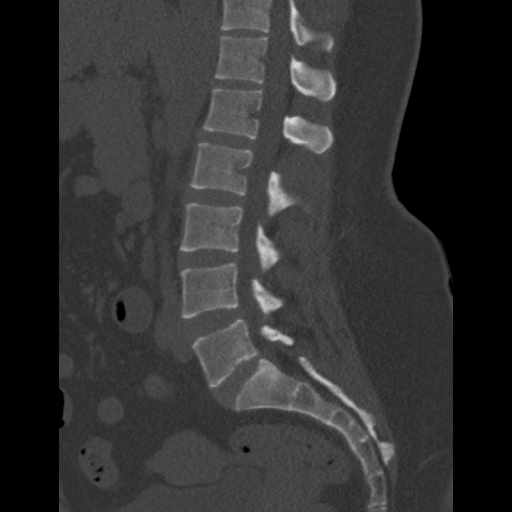
[im 35/60  bone]
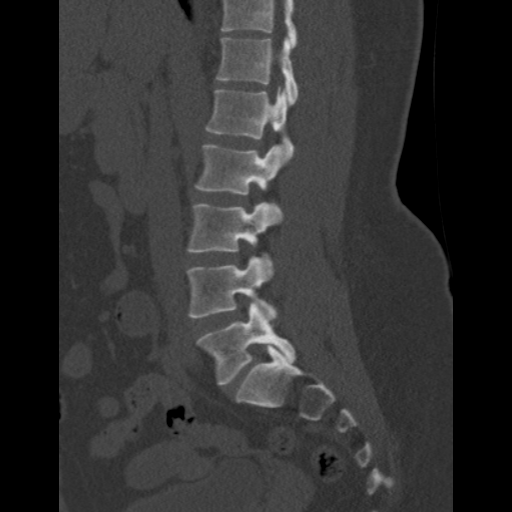
[im 40/60  bone]
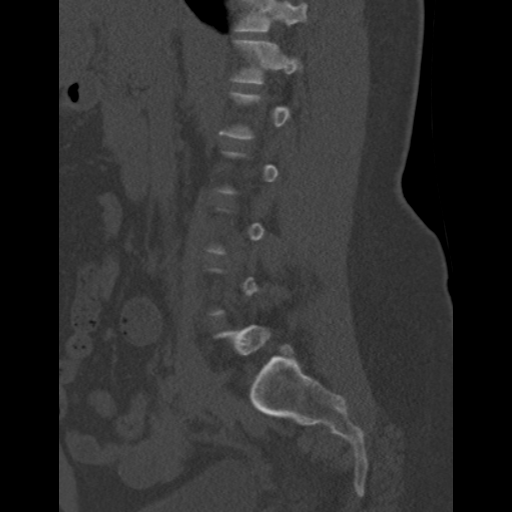

[16 of 33 positions shown; findings below may reference images not displayed]

FINDINGS: Segmentation: 5 lumbar type vertebrae.

Alignment: Normal.

Vertebrae: No acute fracture or suspicious osseous lesion.

Paraspinal and other soft tissues: No acute abnormality identified
in the paraspinal soft tissues. Intra-abdominal and pelvic contents
reported separately.

Disc levels: Intervertebral disc space heights are preserved. There
is mild disc bulging at L5-S1 and at most minimal disc bulging at
L4-5 without evidence of a significant stenosis.
IMPRESSION: 1. No acute osseous abnormality.
2. At most mild lower lumbar disc bulging without evidence of
significant stenosis.

## 2023-07-09 ENCOUNTER — Encounter: Payer: Self-pay | Admitting: Family

## 2023-07-09 ENCOUNTER — Telehealth: Payer: Self-pay | Admitting: Family

## 2023-07-09 ENCOUNTER — Ambulatory Visit (INDEPENDENT_AMBULATORY_CARE_PROVIDER_SITE_OTHER): Payer: Self-pay | Admitting: Family

## 2023-07-09 VITALS — BP 127/85 | HR 63 | Temp 98.9°F | Ht 63.0 in | Wt 210.0 lb

## 2023-07-09 DIAGNOSIS — Z758 Other problems related to medical facilities and other health care: Secondary | ICD-10-CM

## 2023-07-09 DIAGNOSIS — N6310 Unspecified lump in the right breast, unspecified quadrant: Secondary | ICD-10-CM

## 2023-07-09 DIAGNOSIS — Z603 Acculturation difficulty: Secondary | ICD-10-CM

## 2023-07-09 DIAGNOSIS — N644 Mastodynia: Secondary | ICD-10-CM

## 2023-07-09 NOTE — Progress Notes (Signed)
Patient ID: Melanie Newman, female    DOB: 1985-10-31  MRN: [id number]  CC: Breast Pain  Subjective: Melanie Newman is a 37 y.o. female who presents for breast pain.   Her concerns today include:  States right breast lump causing pain for more than 1 year. States she was seen in the past from another provider and "nothing was done". Reports recently left breast causing pain. She is not breastfeeding. Reports has IUD for contraception management. Denies family history of breast cancer. Taking over-the-counter Naproxen to help with pain.    Patient Active Problem List   Diagnosis Date Noted   Acute ear pain, bilateral 10/11/2021   S/P laparoscopic cholecystectomy 06/14/2020   Labor and delivery indication for care or intervention 09/06/2014   [redacted] weeks gestation of pregnancy    Evaluate fetal position using ultrasound      Current Outpatient Medications on File Prior to Visit  Medication Sig Dispense Refill   benzonatate (TESSALON) 100 MG capsule Take 1 to 2 capsules by mouth 3 times daily as needed. (Patient not taking: Reported on 12/21/2022) 20 capsule 0   cyclobenzaprine (FLEXERIL) 5 MG tablet Take 1 tablet (5 mg total) by mouth 3 (three) times daily as needed for muscle spasms. (Patient not taking: Reported on 11/30/2021) 12 tablet 0   fluticasone (FLONASE) 50 MCG/ACT nasal spray Place 2 sprays into both nostrils daily. (Patient not taking: Reported on 11/30/2021) 16 g 6   methocarbamol (ROBAXIN) 500 MG tablet Take 1 tablet (500 mg total) by mouth 4 (four) times daily. (Patient not taking: Reported on 07/09/2023) 60 tablet 1   naproxen (NAPROSYN) 500 MG tablet Take 1 tablet (500 mg total) by mouth 2 (two) times daily with a meal. Prn pain (Patient not taking: Reported on 07/09/2023) 60 tablet 1   omeprazole (PRILOSEC) 20 MG capsule Take 2 capsules (40 mg total) by mouth daily. (Patient not taking: Reported on 11/30/2021) 180 capsule 0   omeprazole (PRILOSEC) 40 MG capsule  Take 1 capsule by mouth once daily (Patient not taking: Reported on 10/02/2022) 90 capsule 0   ondansetron (ZOFRAN-ODT) 8 MG disintegrating tablet Take 1 tablet (8 mg total) by mouth every 8 (eight) hours as needed for nausea or vomiting. (Patient not taking: Reported on 03/02/2022) 20 tablet 0   paragard intrauterine copper IUD IUD 1 each by Intrauterine route once. (Patient not taking: Reported on 03/02/2022)     sucralfate (CARAFATE) 1 g tablet Take 1 tablet (1 g total) by mouth 4 (four) times daily -  with meals and at bedtime. (Patient not taking: Reported on 10/02/2022) 120 tablet 2   No current facility-administered medications on file prior to visit.    No Known Allergies  Social History   Socioeconomic History   Marital status: Single    Spouse name: Not on file   Number of children: Not on file   Years of education: Not on file   Highest education level: Not on file  Occupational History   Not on file  Tobacco Use   Smoking status: Never   Smokeless tobacco: Never  Vaping Use   Vaping status: Never Used  Substance and Sexual Activity   Alcohol use: Yes    Comment: social   Drug use: No   Sexual activity: Not Currently    Birth control/protection: I.U.D.  Other Topics Concern   Not on file  Social History Narrative   ** Merged History Encounter **       Social  Determinants of Health   Financial Resource Strain: Not on file  Food Insecurity: Not on file  Transportation Needs: Not on file  Physical Activity: Not on file  Stress: Not on file  Social Connections: Not on file  Intimate Partner Violence: Not on file    Family History  Problem Relation Age of Onset   Osteoarthritis Mother    Renal Disease Brother    Jaundice Nephew        unknown reason   Diabetes Neg Hx    Hypertension Neg Hx    Hyperlipidemia Neg Hx    Colon cancer Neg Hx    Stomach cancer Neg Hx    Pancreatic cancer Neg Hx    Esophageal cancer Neg Hx    Rectal cancer Neg Hx     Past  Surgical History:  Procedure Laterality Date   CHOLECYSTECTOMY     CHOLECYSTECTOMY  08/07/2008   IUD REMOVAL      ROS: Review of Systems Negative except as stated above  PHYSICAL EXAM: BP 127/85   Pulse 63   Temp 98.9 F (37.2 C) (Oral)   Ht 5\' 3"  (1.6 m)   Wt 210 lb (95.3 kg)   SpO2 97%   BMI 37.20 kg/m   Physical Exam HENT:     Head: Normocephalic and atraumatic.     Nose: Nose normal.     Mouth/Throat:     Mouth: Mucous membranes are moist.     Pharynx: Oropharynx is clear.  Eyes:     Extraocular Movements: Extraocular movements intact.     Conjunctiva/sclera: Conjunctivae normal.     Pupils: Pupils are equal, round, and reactive to light.  Cardiovascular:     Rate and Rhythm: Normal rate and regular rhythm.     Pulses: Normal pulses.     Heart sounds: Normal heart sounds.  Pulmonary:     Effort: Pulmonary effort is normal.     Breath sounds: Normal breath sounds.  Chest:  Breasts:    Right: Mass present.     Left: Normal.     Comments: Curley Spice, CMA present.  Musculoskeletal:        General: Normal range of motion.     Cervical back: Normal range of motion and neck supple.  Neurological:     General: No focal deficit present.     Mental Status: She is alert and oriented to person, place, and time.  Psychiatric:        Mood and Affect: Mood normal.        Behavior: Behavior normal.     ASSESSMENT AND PLAN: 1. Breast pain, left 2. Breast pain, right 3. Mass of right breast, unspecified quadrant - Routine screening.  - MM Digital Screening; Future  4. Language barrier - [place] in-person interpreter, Anjie.   Patient was given the opportunity to ask questions.  Patient verbalized understanding of the plan and was able to repeat key elements of the plan. Patient was given clear instructions to go to Emergency Department or return to medical center if symptoms don't improve, worsen, or new problems develop.The patient verbalized  understanding.   Orders Placed This Encounter  Procedures   MM Digital Screening    Return in about 2 weeks (around 07/23/2023) for Follow-Up or next available with Bertram Denver, NP .  Rema Fendt, NP

## 2023-07-09 NOTE — Progress Notes (Signed)
Patient states last time she came in, she was told she had a ball of fat on the right side of breast, states now it's swelling and throbbing.

## 2023-07-09 NOTE — Telephone Encounter (Signed)
error 

## 2023-07-10 ENCOUNTER — Other Ambulatory Visit: Payer: Self-pay | Admitting: Family

## 2023-07-10 ENCOUNTER — Telehealth: Payer: Self-pay | Admitting: Nurse Practitioner

## 2023-07-10 DIAGNOSIS — N644 Mastodynia: Secondary | ICD-10-CM

## 2023-07-10 DIAGNOSIS — N6310 Unspecified lump in the right breast, unspecified quadrant: Secondary | ICD-10-CM

## 2023-07-10 NOTE — Telephone Encounter (Signed)
Complete

## 2023-07-10 NOTE — Telephone Encounter (Signed)
Sue Lush with the [facility] has called and states that the wrong orders for the patient was sent over. Sue Lush states patient needs Bilateral Diagnostic Order and Bilateral Ultrasound Order sent over. Per Sue Lush, they need these orders before they can schedule the patient. Please advise.  [facility] callback # [phone] EXT [phone] Sue Lush)

## 2023-07-12 ENCOUNTER — Telehealth: Payer: Self-pay

## 2023-07-12 ENCOUNTER — Other Ambulatory Visit: Payer: Self-pay

## 2023-07-12 DIAGNOSIS — N644 Mastodynia: Secondary | ICD-10-CM

## 2023-07-12 DIAGNOSIS — N6310 Unspecified lump in the right breast, unspecified quadrant: Secondary | ICD-10-CM

## 2023-07-12 NOTE — Telephone Encounter (Signed)
 Telephoned patient at mobile number using interpreter, Delorise Royals. Left a voice message with B[facility]CP contact information.

## 2023-08-16 ENCOUNTER — Telehealth: Payer: Self-pay

## 2023-08-16 ENCOUNTER — Other Ambulatory Visit: Payer: Self-pay

## 2023-08-16 ENCOUNTER — Ambulatory Visit: Payer: Self-pay

## 2023-08-16 NOTE — Telephone Encounter (Signed)
 Returned patient's call using language line interpreter#[phone]. Patient needs to reschedule missed appointments. I will contact the [facility] and reschedule both appointments and contact patient. [facility].

## 2023-08-30 ENCOUNTER — Ambulatory Visit: Payer: Self-pay | Admitting: *Deleted

## 2023-08-30 VITALS — BP 120/79 | Wt 211.5 lb

## 2023-08-30 DIAGNOSIS — N644 Mastodynia: Secondary | ICD-10-CM

## 2023-08-30 DIAGNOSIS — R2231 Localized swelling, mass and lump, right upper limb: Secondary | ICD-10-CM

## 2023-08-30 DIAGNOSIS — Z1239 Encounter for other screening for malignant neoplasm of breast: Secondary | ICD-10-CM

## 2023-08-30 NOTE — Patient Instructions (Signed)
Explained breast self awareness with [name]. Patient did not need a Pap smear today due to last Pap smear was in September 2022 per patient. Let her know B[facility]CP will cover Pap smears every 3 years unless has a history of abnormal Pap smears. Referred patient to the [facility] for a diagnostic mammogram. Appointment scheduled Tuesday, September 11, 2023 at 0940. Patient aware of appointment and will be there. [name] verbalized understanding.  Keagen Heinlen, Kathaleen Maser, RN 1:32 PM

## 2023-08-30 NOTE — Progress Notes (Signed)
[name] is a 38 y.o. female who presents to [facility] clinic today with complaint of right axillary lump x 9 years that has increased over the last 3 years that is painful at times. Patient rates the pain at a 6 out of 10.    Pap Smear: Pap smear not completed today. Last Pap smear was in September 2022 at the [facility] clinic and was normal per patient. Per patient has no history of an abnormal Pap smear. Last Pap smear result is not available in Epic.   Physical exam: Breasts Breasts symmetrical. No skin abnormalities bilateral breasts. No nipple retraction bilateral breasts. No nipple discharge bilateral breasts. No lymphadenopathy. No lumps palpated left breast. Palpated a lump within the right axilla at 11 o'clock 15 cm from the nipple. Complaints of right axillary and right outer breast pain on exam.   Pelvic/Bimanual Pap is not indicated today per BCCCP guidelines.   Smoking History: Patient has never smoked.   Patient Navigation: Patient education provided. Access to services provided for patient through [place] program. Spanish interpreter Natale Lay from [facility] provided.   Breast and Cervical Cancer Risk Assessment: Patient does not have family history of breast cancer, known genetic mutations, or radiation treatment to the chest before age 51. Patient does not have history of cervical dysplasia, immunocompromised, or DES exposure in-utero.  Risk Scores as of Encounter on 08/30/2023     Dondra Spry           5-year 0.12%   Lifetime 4.72%            Last calculated by Caprice Red, CMA on 08/30/2023 at  1:42 PM       A: [facility]CP exam without pap smear Complaint of right axillary lump and pain.  P: Referred patient to the [facility] for a diagnostic mammogram. Appointment scheduled Tuesday, September 11, 2023 at 0940.  Priscille Heidelberg, RN 08/30/2023 1:32 PM

## 2023-09-03 ENCOUNTER — Ambulatory Visit: Payer: Self-pay | Admitting: Nurse Practitioner

## 2023-09-11 ENCOUNTER — Ambulatory Visit
Admission: RE | Admit: 2023-09-11 | Discharge: 2023-09-11 | Disposition: A | Discharge status: Home / Self Care | Payer: Self-pay | Source: Ambulatory Visit | Attending: Obstetrics and Gynecology | Admitting: Obstetrics and Gynecology

## 2023-09-11 ENCOUNTER — Other Ambulatory Visit: Payer: Self-pay | Admitting: Obstetrics and Gynecology

## 2023-09-11 ENCOUNTER — Ambulatory Visit
Admission: RE | Admit: 2023-09-11 | Discharge: 2023-09-11 | Disposition: A | Discharge status: Home / Self Care | Payer: No Typology Code available for payment source | Source: Ambulatory Visit | Attending: Obstetrics and Gynecology | Admitting: Obstetrics and Gynecology

## 2023-09-11 DIAGNOSIS — N6310 Unspecified lump in the right breast, unspecified quadrant: Secondary | ICD-10-CM

## 2023-09-11 DIAGNOSIS — N6320 Unspecified lump in the left breast, unspecified quadrant: Secondary | ICD-10-CM

## 2023-09-11 DIAGNOSIS — N644 Mastodynia: Secondary | ICD-10-CM

## 2023-09-19 ENCOUNTER — Ambulatory Visit: Payer: Self-pay | Admitting: Surgery

## 2023-09-26 ENCOUNTER — Other Ambulatory Visit: Payer: Self-pay | Admitting: Obstetrics and Gynecology

## 2023-09-26 DIAGNOSIS — N644 Mastodynia: Secondary | ICD-10-CM

## 2023-09-28 ENCOUNTER — Encounter: Payer: Self-pay | Admitting: Obstetrics & Gynecology

## 2023-10-10 ENCOUNTER — Ambulatory Visit: Payer: Self-pay | Admitting: Surgery

## 2023-11-14 ENCOUNTER — Ambulatory Visit: Attending: Nurse Practitioner | Admitting: Nurse Practitioner

## 2023-11-28 ENCOUNTER — Other Ambulatory Visit: Payer: Self-pay

## 2023-11-28 ENCOUNTER — Ambulatory Visit: Payer: Self-pay | Attending: Physician Assistant | Admitting: Physician Assistant

## 2023-11-28 VITALS — BP 116/76 | HR 60 | Ht 63.0 in | Wt 208.0 lb

## 2023-11-28 DIAGNOSIS — K2970 Gastritis, unspecified, without bleeding: Secondary | ICD-10-CM

## 2023-11-28 DIAGNOSIS — R110 Nausea: Secondary | ICD-10-CM

## 2023-11-28 DIAGNOSIS — R1013 Epigastric pain: Secondary | ICD-10-CM

## 2023-11-28 DIAGNOSIS — G44209 Tension-type headache, unspecified, not intractable: Secondary | ICD-10-CM

## 2023-11-28 DIAGNOSIS — R109 Unspecified abdominal pain: Secondary | ICD-10-CM

## 2023-11-28 DIAGNOSIS — Z758 Other problems related to medical facilities and other health care: Secondary | ICD-10-CM

## 2023-11-28 DIAGNOSIS — R5383 Other fatigue: Secondary | ICD-10-CM

## 2023-11-28 DIAGNOSIS — Z603 Acculturation difficulty: Secondary | ICD-10-CM

## 2023-11-28 DIAGNOSIS — R6889 Other general symptoms and signs: Secondary | ICD-10-CM

## 2023-11-28 LAB — POCT URINALYSIS DIP (CLINITEK)
Bilirubin, UA: NEGATIVE
Blood, UA: NEGATIVE
Glucose, UA: NEGATIVE mg/dL
Ketones, POC UA: NEGATIVE mg/dL
Leukocytes, UA: NEGATIVE
Nitrite, UA: NEGATIVE
POC PROTEIN,UA: NEGATIVE
Spec Grav, UA: 1.02 (ref 1.010–1.025)
Urobilinogen, UA: 1 U/dL
pH, UA: 8.5 — AB (ref 5.0–8.0)

## 2023-11-28 MED ORDER — OMEPRAZOLE 40 MG PO CPDR
40.0000 mg | DELAYED_RELEASE_CAPSULE | Freq: Every day | ORAL | 0 refills | Status: DC
  Filled 2023-11-28: qty 90, 90d supply, fill #0

## 2023-11-28 MED ORDER — ONDANSETRON HCL 4 MG PO TABS
4.0000 mg | ORAL_TABLET | Freq: Three times a day (TID) | ORAL | 0 refills | Status: DC | PRN
  Filled 2023-11-28: qty 20, 7d supply, fill #0

## 2023-11-28 NOTE — Progress Notes (Unsigned)
 Patient ID: Melanie Newman, female   DOB: 31-Dec-1985, 38 y.o.   MRN: [id number]     Melanie Newman, is a 38 y.o. female  YQM:[id number]  [id number]  DOB - Jul 13, 1986  Chief Complaint  Patient presents with   Dizziness    Episodes of lightheadedness, facial stiffness, cold sweats, blue lips, nausea, weakness, headache X1 week Increased frequency of tachycardia  Requesting blood work including lipid panel       Subjective:   Melanie Newman is a 38 y.o. female here today for 2 weeks h/o: Various complaints including nausea, HA, dizziness,  feels cold.  No vomiting.  Poor appetite.  No diarrhea. No constipation.  No melena or hematochezia.  She felt similar when she had ulcers in the past.  Does not think she is pregnant.  Also has vague L flank pain but no dysuria or vaginal s/sx.  No vision changes.    2 nights ago felt weak and sweaty with head numbness.  She did not check temp.    LMP unsure but has IUD.   No problems updated.  ALLERGIES: No Known Allergies  PAST MEDICAL HISTORY: Past Medical History:  Diagnosis Date   Anxiety    Depression    mild pp depression after 1st pregnancy   Gastritis    GERD (gastroesophageal reflux disease)    Heart murmur    maybe in the past but many years ago   UTI (lower urinary tract infection)     MEDICATIONS AT HOME: Prior to Admission medications   Medication Sig Start Date End Date Taking? Authorizing Provider  ondansetron  (ZOFRAN ) 4 MG tablet Take 1 tablet (4 mg total) by mouth every 8 (eight) hours as needed for nausea or vomiting. 11/28/23  Yes Shantelle Alles, Stan Eans, PA-C  omeprazole  (PRILOSEC) 40 MG capsule Take 1 capsule (40 mg total) by mouth daily. 11/28/23   Hassie Lint, PA-C  paragard intrauterine copper IUD IUD 1 each by Intrauterine route once. Patient not taking: Reported on 11/28/2023    [provider]    ROS: Neg HEENT Neg resp Neg cardiac Neg psych Neg neuro  Objective:    Vitals:   11/28/23 1621  BP: 116/76  Pulse: 60  TempSrc: Oral  SpO2: 99%  Weight: 208 lb (94.3 kg)  Height: 5\' 3"  (1.6 m)   Exam General appearance : Awake, alert, not in any distress. Speech Clear. Not toxic looking HEENT: Atraumatic and Normocephalic, pupils equally reactive to light and accomodation, EOM intact.  B TM WNL.  Mucus membranes not dry but not moist Neck: Supple, no JVD. No cervical lymphadenopathy.  Chest: Good air entry bilaterally, CTAB.  No rales/rhonchi/wheezing CVS: S1 S2 regular, no murmurs.  Abdomen: Bowel sounds present, Non tender and not distended with no gaurding, rigidity or rebound. Extremities: B/L Lower Ext shows no edema, both legs are warm to touch Neurology: Awake alert, and oriented X 3, CN II-XII intact, Non focal Skin: No Rash  Data Review Lab Results  Component Value Date   HGBA1C 5.6 12/21/2022   HGBA1C 5.8 (H) 04/20/2021   HGBA1C 5.1 03/29/2018    Assessment & Plan   1. Nausea (Primary) - POCT urine pregnancy - Comprehensive metabolic panel - H. pylori breath test - Lipase - CBC with Differential - ondansetron  (ZOFRAN ) 4 MG tablet; Take 1 tablet (4 mg total) by mouth every 8 (eight) hours as needed for nausea or vomiting.  Dispense: 20 tablet; Refill: 0  2. Tension-type headache, not intractable, unspecified chronicity  pattern Likely secondary to dehydration  3. Fatigue, unspecified type - TSH  4. Temperature intolerance - POCT URINALYSIS DIP (CLINITEK) - TSH  5. Left flank pain UA neg for infection and preg - POCT URINALYSIS DIP (CLINITEK) - POCT urine pregnancy - Lipase  6. Language barrier [facility]  [place]en [place] [phone] interpreters used and additional time performing visit was required.   7. Abdominal pain, epigastric Non acute abdomen - ondansetron  (ZOFRAN ) 4 MG tablet; Take 1 tablet (4 mg total) by mouth every 8 (eight) hours as needed for nausea or vomiting.  Dispense: 20 tablet; Refill: 0 - omeprazole  (PRILOSEC)  40 MG capsule; Take 1 capsule (40 mg total) by mouth daily.  Dispense: 90 capsule; Refill: 0  8. Gastritis without bleeding, unspecified chronicity, unspecified gastritis type - ondansetron  (ZOFRAN ) 4 MG tablet; Take 1 tablet (4 mg total) by mouth every 8 (eight) hours as needed for nausea or vomiting.  Dispense: 20 tablet; Refill: 0 - omeprazole  (PRILOSEC) 40 MG capsule; Take 1 capsule (40 mg total) by mouth daily.  Dispense: 90 capsule; Refill: 0    Return in about 3 months (around 02/27/2024) for PCP for chronic conditions.  The patient was given clear instructions to go to ER or return to medical center if symptoms don't improve, worsen or new problems develop. The patient verbalized understanding. The patient was told to call to get lab results if they haven't heard anything in the next week.      Dulce Gibbs, PA-C [facility] [place], [place] [phone]   11/28/2023, 4:56 PM

## 2023-11-28 NOTE — Patient Instructions (Signed)
Drink 80 ounces water daily °

## 2023-11-29 ENCOUNTER — Other Ambulatory Visit: Payer: Self-pay

## 2023-11-29 ENCOUNTER — Ambulatory Visit: Attending: Family Medicine

## 2023-11-30 LAB — CBC WITH DIFFERENTIAL/PLATELET
Basophils Absolute: 0 10*3/uL (ref 0.0–0.2)
Basos: 1 %
EOS (ABSOLUTE): 0.1 10*3/uL (ref 0.0–0.4)
Eos: 2 %
Hematocrit: 38.3 % (ref 34.0–46.6)
Hemoglobin: 12.5 g/dL (ref 11.1–15.9)
Immature Grans (Abs): 0 10*3/uL (ref 0.0–0.1)
Immature Granulocytes: 0 %
Lymphocytes Absolute: 2.1 10*3/uL (ref 0.7–3.1)
Lymphs: 35 %
MCH: 28.3 pg (ref 26.6–33.0)
MCHC: 32.6 g/dL (ref 31.5–35.7)
MCV: 87 fL (ref 79–97)
Monocytes Absolute: 0.4 10*3/uL (ref 0.1–0.9)
Monocytes: 7 %
Neutrophils Absolute: 3.4 10*3/uL (ref 1.4–7.0)
Neutrophils: 55 %
Platelets: 212 10*3/uL (ref 150–450)
RBC: 4.42 x10E6/uL (ref 3.77–5.28)
RDW: 12.4 % (ref 11.7–15.4)
WBC: 6.1 10*3/uL (ref 3.4–10.8)

## 2023-11-30 LAB — COMPREHENSIVE METABOLIC PANEL WITH GFR
ALT: 39 IU/L — ABNORMAL HIGH (ref 0–32)
AST: 23 IU/L (ref 0–40)
Albumin: 4.4 g/dL (ref 3.9–4.9)
Alkaline Phosphatase: 56 IU/L (ref 44–121)
BUN/Creatinine Ratio: 17 (ref 9–23)
BUN: 13 mg/dL (ref 6–20)
Bilirubin Total: 1.4 mg/dL — ABNORMAL HIGH (ref 0.0–1.2)
CO2: 20 mmol/L (ref 20–29)
Calcium: 9.1 mg/dL (ref 8.7–10.2)
Chloride: 104 mmol/L (ref 96–106)
Creatinine, Ser: 0.75 mg/dL (ref 0.57–1.00)
Globulin, Total: 2.6 g/dL (ref 1.5–4.5)
Glucose: 94 mg/dL (ref 70–99)
Potassium: 4.4 mmol/L (ref 3.5–5.2)
Sodium: 138 mmol/L (ref 134–144)
Total Protein: 7 g/dL (ref 6.0–8.5)
eGFR: 106 mL/min/{1.73_m2} (ref >59)

## 2023-11-30 LAB — TSH: TSH: 1.08 u[IU]/mL (ref 0.450–4.500)

## 2023-11-30 LAB — LIPASE: Lipase: 25 U/L (ref 14–72)

## 2023-11-30 LAB — H. PYLORI BREATH TEST: H pylori Breath Test: NEGATIVE

## 2023-12-11 ENCOUNTER — Other Ambulatory Visit: Payer: Self-pay

## 2023-12-12 ENCOUNTER — Encounter: Payer: Self-pay | Admitting: Physician Assistant

## 2024-02-29 ENCOUNTER — Ambulatory Visit: Payer: Self-pay | Admitting: Nurse Practitioner

## 2024-03-10 ENCOUNTER — Other Ambulatory Visit: Payer: No Typology Code available for payment source

## 2024-03-12 ENCOUNTER — Inpatient Hospital Stay: Admission: RE | Admit: 2024-03-12 | Payer: No Typology Code available for payment source | Source: Ambulatory Visit

## 2024-03-20 ENCOUNTER — Encounter

## 2024-03-20 ENCOUNTER — Other Ambulatory Visit

## 2024-03-25 ENCOUNTER — Encounter

## 2024-03-25 ENCOUNTER — Other Ambulatory Visit

## 2024-04-09 ENCOUNTER — Ambulatory Visit
Admission: RE | Admit: 2024-04-09 | Discharge: 2024-04-09 | Disposition: A | Discharge status: Home / Self Care | Source: Ambulatory Visit | Attending: Obstetrics and Gynecology | Admitting: Obstetrics and Gynecology

## 2024-04-09 DIAGNOSIS — N644 Mastodynia: Secondary | ICD-10-CM

## 2024-05-12 ENCOUNTER — Other Ambulatory Visit: Payer: Self-pay

## 2024-05-13 ENCOUNTER — Ambulatory Visit (INDEPENDENT_AMBULATORY_CARE_PROVIDER_SITE_OTHER)

## 2024-05-13 ENCOUNTER — Encounter (HOSPITAL_COMMUNITY): Payer: Self-pay

## 2024-05-13 ENCOUNTER — Ambulatory Visit (HOSPITAL_COMMUNITY)
Admission: EM | Admit: 2024-05-13 | Discharge: 2024-05-13 | Disposition: A | Discharge status: Home / Self Care | Attending: Emergency Medicine | Admitting: Emergency Medicine

## 2024-05-13 ENCOUNTER — Other Ambulatory Visit: Payer: Self-pay

## 2024-05-13 DIAGNOSIS — M25521 Pain in right elbow: Secondary | ICD-10-CM

## 2024-05-13 DIAGNOSIS — M25561 Pain in right knee: Secondary | ICD-10-CM

## 2024-05-13 DIAGNOSIS — J069 Acute upper respiratory infection, unspecified: Secondary | ICD-10-CM

## 2024-05-13 MED ORDER — NAPROXEN 500 MG PO TABS
500.0000 mg | ORAL_TABLET | Freq: Two times a day (BID) | ORAL | 0 refills | Status: DC
  Filled 2024-05-13 (×2): qty 30, 15d supply, fill #0

## 2024-05-13 MED ORDER — PROMETHAZINE-DM 6.25-15 MG/5ML PO SYRP
5.0000 mL | ORAL_SOLUTION | Freq: Every evening | ORAL | 0 refills | Status: DC | PRN
  Filled 2024-05-13 – 2024-05-14 (×3): qty 118, 23d supply, fill #0

## 2024-05-13 MED ORDER — BENZONATATE 100 MG PO CAPS
100.0000 mg | ORAL_CAPSULE | Freq: Three times a day (TID) | ORAL | 0 refills | Status: DC
  Filled 2024-05-13 (×2): qty 21, 7d supply, fill #0

## 2024-05-13 NOTE — ED Provider Notes (Signed)
 [facility]    CSN: [id number] Arrival date & time: 05/13/24  1447      History   Chief Complaint Chief Complaint  Patient presents with   Cough   Arm Injury    HPI Melanie Newman is a 38 y.o. female.   Patient presents with right elbow pain after accidentally hitting her elbow on her car about 20 days ago.  Patient states the pain radiates from her right elbow down to her hand with certain movements.  Patient denies numbness, weakness, or tingling.  Patient states that she also has right knee pain that has been bothering her on and off over the last few months.  Patient states that a few months ago she stepped wrong and felt her knee twist and since then she has had pain.  Patient states that the pain worsens with walking around at work and having to stand for long periods of time. Patient states that she has been taking ibuprofen  with minimal relief of her pain.  Patient also reports a dry cough for about 8 days.  Patient states that she has had some mild congestion, bilateral ear pain, and chills with this as well.  Patient denies known fever, body aches, sore throat, chest pain, shortness of breath, nausea, vomiting, diarrhea, and abdominal pain.  Patient denies any known sick exposures.  Patient denies any history of asthma or COPD.  The history is provided by the patient and medical records. The history is limited by a language barrier. A language interpreter was used (Spanish interpreter).  Cough Arm Injury   Past Medical History:  Diagnosis Date   Anxiety    Depression    mild pp depression after 1st pregnancy   Gastritis    GERD (gastroesophageal reflux disease)    Heart murmur    maybe in the past but many years ago   UTI (lower urinary tract infection)     Patient Active Problem List   Diagnosis Date Noted   Acute ear pain, bilateral 10/11/2021   S/P laparoscopic cholecystectomy 06/14/2020   Labor and delivery indication for care or  intervention 09/06/2014   [redacted] weeks gestation of pregnancy    Evaluate fetal position using ultrasound     Past Surgical History:  Procedure Laterality Date   CHOLECYSTECTOMY     CHOLECYSTECTOMY  08/07/2008   IUD REMOVAL      OB History     Gravida  5   Para  4   Term  4   Preterm  0   AB  1   Living  1      SAB  1   IAB  0   Ectopic  0   Multiple      Live Births  1            Home Medications    Prior to Admission medications   Medication Sig Start Date End Date Taking? Authorizing Provider  benzonatate  (TESSALON ) 100 MG capsule Take 1 capsule (100 mg total) by mouth every 8 (eight) hours. 05/13/24  Yes Johnie, Deette Revak A, NP  naproxen  (NAPROSYN ) 500 MG tablet Take 1 tablet (500 mg total) by mouth 2 (two) times daily. 05/13/24  Yes Johnie Flaming A, NP  promethazine-dextromethorphan (PROMETHAZINE-DM) 6.25-15 MG/5ML syrup Take 5 mLs by mouth at bedtime as needed for cough. 05/13/24  Yes Johnie Flaming LABOR, NP    Family History Family History  Problem Relation Age of Onset   Osteoarthritis Mother  Renal Disease Brother    Jaundice Nephew        unknown reason   Diabetes Neg Hx    Hypertension Neg Hx    Hyperlipidemia Neg Hx    Colon cancer Neg Hx    Stomach cancer Neg Hx    Pancreatic cancer Neg Hx    Esophageal cancer Neg Hx    Rectal cancer Neg Hx     Social History Social History   Tobacco Use   Smoking status: Never   Smokeless tobacco: Never  Vaping Use   Vaping status: Never Used  Substance Use Topics   Alcohol use: Yes    Comment: social   Drug use: No     Allergies   Patient has no known allergies.   Review of Systems Review of Systems  Respiratory:  Positive for cough.    Per HPI  Physical Exam Triage Vital Signs ED Triage Vitals  Encounter Vitals Group     BP 05/13/24 1556 111/76     Girls Systolic BP Percentile --      Girls Diastolic BP Percentile --      Boys Systolic BP Percentile --      Boys  Diastolic BP Percentile --      Pulse Rate 05/13/24 1556 68     Resp 05/13/24 1556 18     Temp 05/13/24 1556 98.6 F (37 C)     Temp Source 05/13/24 1556 Oral     SpO2 05/13/24 1556 98 %     Weight --      Height --      Head Circumference --      Peak Flow --      Pain Score 05/13/24 1555 5     Pain Loc --      Pain Education --      Exclude from Growth Chart --    No data found.  Updated Vital Signs BP 111/76 (BP Location: Left Arm)   Pulse 68   Temp 98.6 F (37 C) (Oral)   Resp 18   SpO2 98%   Visual Acuity Right Eye Distance:   Left Eye Distance:   Bilateral Distance:    Right Eye Near:   Left Eye Near:    Bilateral Near:     Physical Exam Vitals and nursing note reviewed.  Constitutional:      General: She is awake. She is not in acute distress.    Appearance: Normal appearance. She is well-developed and well-groomed. She is not ill-appearing.  HENT:     Right Ear: Tympanic membrane, ear canal and external ear normal.     Left Ear: Tympanic membrane, ear canal and external ear normal.     Nose: Congestion and rhinorrhea present.     Mouth/Throat:     Mouth: Mucous membranes are moist.     Pharynx: Posterior oropharyngeal erythema present. No oropharyngeal exudate.  Cardiovascular:     Rate and Rhythm: Normal rate and regular rhythm.  Pulmonary:     Effort: Pulmonary effort is normal.     Breath sounds: Normal breath sounds.  Musculoskeletal:     Right elbow: No swelling, deformity or effusion. Normal range of motion. Tenderness present in olecranon process.     Right knee: No swelling, deformity or bony tenderness. Normal range of motion. No tenderness.  Skin:    General: Skin is warm and dry.  Neurological:     Mental Status: She is alert.  Psychiatric:  Behavior: Behavior is cooperative.      UC Treatments / Results  Labs (all labs ordered are listed, but only abnormal results are displayed) Labs Reviewed - No data to  display  EKG   Radiology DG Knee Complete 4 Views Right Result Date: 05/13/2024 CLINICAL DATA:  Right knee pain after injury 3 weeks ago. EXAM: RIGHT KNEE - COMPLETE 4+ VIEW COMPARISON:  None Available. FINDINGS: No evidence of fracture, dislocation, or joint effusion. No evidence of arthropathy or other focal bone abnormality. Soft tissues are unremarkable. IMPRESSION: Negative. Electronically Signed   By: Lynwood Landy Raddle M.D.   On: 05/13/2024 17:44   DG Elbow Complete Right Result Date: 05/13/2024 CLINICAL DATA:  Right elbow pain after injury 3 weeks ago. EXAM: RIGHT ELBOW - COMPLETE 3+ VIEW COMPARISON:  None Available. FINDINGS: There is no evidence of fracture, dislocation, or joint effusion. There is no evidence of arthropathy or other focal bone abnormality. Soft tissues are unremarkable. IMPRESSION: Negative. Electronically Signed   By: Lynwood Landy Raddle M.D.   On: 05/13/2024 17:42    Procedures Procedures (including critical care time)  Medications Ordered in UC Medications - No data to display  Initial Impression / Assessment and Plan / UC Course  I have reviewed the triage vital signs and the nursing notes.  Pertinent labs & imaging results that were available during my care of the patient were reviewed by me and considered in my medical decision making (see chart for details).     Patient is overall well-appearing.  Elbow and knee x-ray ordered.  Based on my interpretation there is no acute osseous abnormality.  Radiology report confirms this.  Prescribed naproxen  as needed for pain.  Given orthopedic follow-up if needed.  Cough and congestion likely viral in nature.  Prescribed Tessalon  and Promethazine DM as needed for cough.  Discussed over-the-counter medications as needed for symptoms.  Discussed follow-up and return precautions. Final Clinical Impressions(s) / UC Diagnoses   Final diagnoses:  Right elbow pain  Acute pain of right knee  Viral URI with cough      Discharge Instructions      Las radiografas de rodilla y codo no revelaron fracturas ni lesiones subyacentes. Puede tomar [employer] al da segn sea necesario para Chief Technology Officer. No lo tome con ibuprofeno. Puede tomar de 500 a 1000 mg de Tylenol  cada 6 a 8 horas segn sea necesario para el dolor irruptivo. Alterne entre hielo y calor y realice estiramientos suaves para Engineer, materials. Si el dolor persiste, consulte con un especialista en medicina deportiva de [place] para una evaluacin ms detallada.  Creo que su tos est relacionada con una enfermedad respiratoria viral. Puede tomar Tessalon  cada 8 horas segn sea necesario para la tos. Puede tomar jarabe para la tos Prometazina DM antes de acostarse segn sea necesario para la tos. Esto puede causar somnolencia, as que no conduzca, trabaje ni beba alcohol mientras lo est tomando. Tambin puede tomar Mucinex (guaifenesina) de venta libre segn sea necesario para la tos y la congestin. Asegrese de mantenerse hidratado y descansar lo suficiente. Realice un seguimiento con su proveedor de atencin primaria o regrese aqu segn sea necesario.  Your knee and elbow x-rays did not reveal any underlying fractures or injuries. You can take naproxen  twice daily as needed for pain.  Do not take this with ibuprofen .  You can take 500 to 1000 mg of Tylenol  every 6-8 hours as needed for breakthrough pain. Alternate between ice and  heat and do some gentle stretching to help with pain. Follow-up with [facility] if your pain continues for further evaluation.  I believe your cough is related to a viral respiratory illness. You can take Tessalon  every 8 hours as needed for cough. You can take Promethazine DM cough syrup at bedtime as needed for cough.  This can make you drowsy so do not drive, work, or drink alcohol while taking this. You can also take over-the-counter Mucinex (guaifenesin) as needed for cough and  congestion as well. Make sure you are staying hydrated and getting plenty of rest. Follow-up with your primary care provider or return here as needed.     ED Prescriptions     Medication Sig Dispense Auth. Provider   benzonatate  (TESSALON ) 100 MG capsule Take 1 capsule (100 mg total) by mouth every 8 (eight) hours. 21 capsule Johnie Flaming A, NP   promethazine-dextromethorphan (PROMETHAZINE-DM) 6.25-15 MG/5ML syrup Take 5 mLs by mouth at bedtime as needed for cough. 118 mL Johnie Flaming A, NP   naproxen  (NAPROSYN ) 500 MG tablet Take 1 tablet (500 mg total) by mouth 2 (two) times daily. 30 tablet Johnie Flaming A, NP      PDMP not reviewed this encounter.   Johnie Flaming A, NP 05/13/24 1754

## 2024-05-13 NOTE — ED Triage Notes (Signed)
 Per interpreter, pt states hit her rt arm on her car 20 days ago, c/o rt arm/elbow/hand pain. C/o rt knee pain.  Pt c/o dry cough for 8 days. States worse in the morning and at night. Also states having bilateral ear pain and chills. States taking ibuprofen  with little relief.

## 2024-05-13 NOTE — Discharge Instructions (Signed)
 Las radiografas de rodilla y codo no revelaron fracturas ni lesiones subyacentes. Puede tomar [employer] al da segn sea necesario para Chief Technology Officer. No lo tome con ibuprofeno. Puede tomar de 500 a 1000 mg de Tylenol  cada 6 a 8 horas segn sea necesario para el dolor irruptivo. Alterne entre hielo y calor y realice estiramientos suaves para Engineer, materials. Si el dolor persiste, consulte con un especialista en medicina deportiva de [place] para una evaluacin ms detallada.  Creo que su tos est relacionada con una enfermedad respiratoria viral. Puede tomar Tessalon  cada 8 horas segn sea necesario para la tos. Puede tomar jarabe para la tos Prometazina DM antes de acostarse segn sea necesario para la tos. Esto puede causar somnolencia, as que no conduzca, trabaje ni beba alcohol mientras lo est tomando. Tambin puede tomar Mucinex (guaifenesina) de venta libre segn sea necesario para la tos y la congestin. Asegrese de mantenerse hidratado y descansar lo suficiente. Realice un seguimiento con su proveedor de atencin primaria o regrese aqu segn sea necesario.  Your knee and elbow x-rays did not reveal any underlying fractures or injuries. You can take naproxen  twice daily as needed for pain.  Do not take this with ibuprofen .  You can take 500 to 1000 mg of Tylenol  every 6-8 hours as needed for breakthrough pain. Alternate between ice and heat and do some gentle stretching to help with pain. Follow-up with [facility] if your pain continues for further evaluation.  I believe your cough is related to a viral respiratory illness. You can take Tessalon  every 8 hours as needed for cough. You can take Promethazine DM cough syrup at bedtime as needed for cough.  This can make you drowsy so do not drive, work, or drink alcohol while taking this. You can also take over-the-counter Mucinex (guaifenesin) as needed for cough and congestion as well. Make sure you are  staying hydrated and getting plenty of rest. Follow-up with your primary care provider or return here as needed.

## 2024-05-14 ENCOUNTER — Other Ambulatory Visit: Payer: Self-pay

## 2024-06-11 ENCOUNTER — Ambulatory Visit: Payer: Self-pay | Attending: Nurse Practitioner | Admitting: Nurse Practitioner

## 2024-08-18 ENCOUNTER — Ambulatory Visit: Payer: Self-pay

## 2024-08-18 NOTE — Telephone Encounter (Signed)
" °  FYI Only or Action Required?: Action required by provider: ED advised, patient requesting follow up after .  Patient was last seen in primary care on 11/28/2023 by Danton Jon HERO, PA-C.  Called Nurse Triage reporting Headache.  Symptoms began 2 weeks.  Interventions attempted: Rest, hydration, or home remedies.  Symptoms are: stable.  Triage Disposition: Go to ED Now (Notify PCP)  Patient/caregiver understands and will follow disposition?: Yes            Reason for Disposition  Loss of vision or double vision  (Exception: Same as previously diagnosed migraines.)  Answer Assessment - Initial Assessment Questions Interpreter spanish Sabian ID [phone] through Interpreter patient reports 2 weeks headaches and when gets headaches nausea and blurred vision . Headache is intermittent. Comes and goes. Pain level is 7/10 . Sometimes feeling heart beating fast. When touching chin to chest back of neck it hurts. Eyes when force to read things , eye pain. Patient advised to go to ER to be evaluated patient is agreeable will send FYI to office.  Also end of call wanted to schedule visit for some arm and hand pain as well , requesting call back to schedule this.     1. LOCATION: Where does it hurt?      Head is hurting  2. ONSET: When did the headache start? (e.g., minutes, hours, days)      2 weeks on and off  3. PATTERN: Does the pain come and go, or has it been constant since it started?     Intermittent  4. SEVERITY: How bad is the pain? and What does it keep you from doing?  (e.g., Scale 1-10; mild, moderate, or severe)     7/10  8. HEAD INJURY: Has there been any recent injury to your head?      denies 9. OTHER SYMPTOMS: Do you have any other symptoms? (e.g., fever, stiff neck, eye pain, sore throat, cold symptoms)     Blurry vision, nausea as well   Patient denies the following chest pain, shortness of breath, numbness or weakness one sided , vomiting     10. PREGNANCY: Is there any chance you are pregnant? When was your last menstrual period?       Denies  Protocols used: [facility]  Copied from CRM [id number]. Topic: Clinical - Red Word Triage >> Aug 18, 2024  4:25 PM Joesph NOVAK wrote: Red Word that prompted transfer to Nurse Triage: Headache, nausea, blurred vision "

## 2024-08-19 ENCOUNTER — Telehealth: Payer: Self-pay | Admitting: Nurse Practitioner

## 2024-08-19 NOTE — Telephone Encounter (Signed)
 Pt confirmed appt

## 2024-08-20 ENCOUNTER — Encounter: Payer: Self-pay | Admitting: Nurse Practitioner

## 2024-08-20 ENCOUNTER — Other Ambulatory Visit: Payer: Self-pay

## 2024-08-20 ENCOUNTER — Ambulatory Visit: Payer: Self-pay | Attending: Nurse Practitioner | Admitting: Nurse Practitioner

## 2024-08-20 VITALS — BP 120/78 | HR 60 | Ht 63.0 in | Wt 215.6 lb

## 2024-08-20 DIAGNOSIS — R7303 Prediabetes: Secondary | ICD-10-CM

## 2024-08-20 DIAGNOSIS — R519 Headache, unspecified: Secondary | ICD-10-CM

## 2024-08-20 DIAGNOSIS — M79641 Pain in right hand: Secondary | ICD-10-CM

## 2024-08-20 DIAGNOSIS — M79601 Pain in right arm: Secondary | ICD-10-CM

## 2024-08-20 MED ORDER — TOPIRAMATE 25 MG PO TABS
25.0000 mg | ORAL_TABLET | Freq: Every day | ORAL | 1 refills | Status: AC
  Filled 2024-08-20: qty 60, 60d supply, fill #0

## 2024-08-20 MED ORDER — METHOCARBAMOL 500 MG PO TABS
500.0000 mg | ORAL_TABLET | Freq: Three times a day (TID) | ORAL | 1 refills | Status: AC | PRN
  Filled 2024-08-20: qty 60, 20d supply, fill #0

## 2024-08-20 MED ORDER — MELOXICAM 15 MG PO TABS
15.0000 mg | ORAL_TABLET | Freq: Every day | ORAL | 0 refills | Status: AC
  Filled 2024-08-20: qty 30, 30d supply, fill #0

## 2024-08-20 NOTE — Progress Notes (Unsigned)
 "  Assessment & Plan:  [name] was seen today for headache and wrist pain.  Diagnoses and all orders for this visit:  Frequent headaches -     topiramate  (TOPAMAX ) 25 MG tablet; Take 1 tablet (25 mg total) by mouth daily. For headaches -     CBC with Differential -     CMP14+EGFR -     Thyroid  Panel With TSH Daily headaches with blurry vision. Suspected sleep apnea due to snoring and frequent awakenings. Acetaminophen  insufficient. - Prescribed daily headache medication. - Planned sleep study pending insurance approval.   Right arm pain -     meloxicam  (MOBIC ) 15 MG tablet; Take 1 tablet (15 mg total) by mouth daily. -     methocarbamol  (ROBAXIN ) 500 MG tablet; Take 1 tablet (500 mg total) by mouth every 8 (eight) hours as needed for muscle spasms. 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 Possible tendonitis. Compression sleeve and naproxen  ineffective. - Prescribed meloxicam . - Recommended physical therapy pending insurance approval. - Ordered labs for thyroid , anemia, and kidney function.   Right hand pain -     meloxicam  (MOBIC ) 15 MG tablet; Take 1 tablet (15 mg total) by mouth daily. -     methocarbamol  (ROBAXIN ) 500 MG tablet; Take 1 tablet (500 mg total) by mouth every 8 (eight) hours as needed for muscle spasms. For right arm pain  Prediabetes -     CBC with Differential -     CMP14+EGFR -     Thyroid  Panel With TSH -     Hemoglobin A1c    Patient has been counseled on age-appropriate routine health concerns for screening and prevention. These are reviewed and up-to-date. Referrals have been placed accordingly. Immunizations are up-to-date or declined.    Subjective:   Chief Complaint  Patient presents with   Headache    Symptoms started three weeks ago. Patient has taken otc tylenol  with little to no relief.    Wrist Pain    Right wrist   History of Present Illness Melanie Newman is a 39 year old female who presents with arm pain  and daily headaches.   VRI was used to communicate directly with patient for the entire encounter including providing detailed patient instructions.    Headaches Blood pressure not contributing factor to headache.She experiences daily headaches that cause blurry vision and difficulty seeing small letters. These headaches occur upon waking and may subside during the day but often return. She uses a medication containing acetaminophen  for tension headaches. Her daughter has observed that she snores heavily and wakes up frequently at night. Her last menstrual period was in October of the previous year, and she currently uses an IUD for birth control. BP Readings from Last 3 Encounters:  08/20/24 120/78  05/13/24 111/76  11/28/23 116/76      She has been experiencing right arm pain for approximately three months, which began after hitting her arm on a car door. The pain restricts her ability to move her arm backward, such as when undoing her bra. Swelling in her hand and wrist is noted, which worsens with the use of a compression sleeve. Massage therapy was attempted, and a compression sleeve was used, but it led to increased swelling. She reports that x-rays were performed and she was told that nothing was found. No improvement in arm pain with naproxen , as she felt the dose was insufficient.   Review of Systems  Constitutional:  Negative for fever, malaise/fatigue and  weight loss.  HENT: Negative.  Negative for nosebleeds.   Eyes: Negative.  Negative for blurred vision, double vision and photophobia.  Respiratory: Negative.  Negative for cough and shortness of breath.   Cardiovascular: Negative.  Negative for chest pain, palpitations and leg swelling.  Gastrointestinal: Negative.  Negative for heartburn, nausea and vomiting.  Musculoskeletal:  Positive for joint pain and myalgias.  Neurological:  Positive for headaches. Negative for dizziness, focal weakness and seizures.   Psychiatric/Behavioral: Negative.  Negative for suicidal ideas.     Past Medical History:  Diagnosis Date   Anxiety    Depression    mild pp depression after 1st pregnancy   Gastritis    GERD (gastroesophageal reflux disease)    Heart murmur    maybe in the past but many years ago   UTI (lower urinary tract infection)     Past Surgical History:  Procedure Laterality Date   CHOLECYSTECTOMY     CHOLECYSTECTOMY  08/07/2008   IUD REMOVAL      Family History  Problem Relation Age of Onset   Osteoarthritis Mother    Renal Disease Brother    Jaundice Nephew        unknown reason   Diabetes Neg Hx    Hypertension Neg Hx    Hyperlipidemia Neg Hx    Colon cancer Neg Hx    Stomach cancer Neg Hx    Pancreatic cancer Neg Hx    Esophageal cancer Neg Hx    Rectal cancer Neg Hx     Social History Reviewed with no changes to be made today.   Outpatient Medications Prior to Visit  Medication Sig Dispense Refill   benzonatate  (TESSALON ) 100 MG capsule Take 1 capsule (100 mg total) by mouth every 8 (eight) hours. 21 capsule 0   naproxen  (NAPROSYN ) 500 MG tablet Take 1 tablet (500 mg total) by mouth 2 (two) times daily. (Patient not taking: Reported on 08/20/2024) 30 tablet 0   promethazine -dextromethorphan (PROMETHAZINE -DM) 6.25-15 MG/5ML syrup Take 5 mLs by mouth at bedtime as needed for cough. 118 mL 0   No facility-administered medications prior to visit.    Allergies[1]     Objective:    BP 120/78 (BP Location: Left Arm, Patient Position: Sitting, Cuff Size: Normal)   Pulse 60   Ht 5' 3 (1.6 m)   Wt 215 lb 9.6 oz (97.8 kg)   LMP  (LMP Unknown)   SpO2 100%   BMI 38.19 kg/m  Wt Readings from Last 3 Encounters:  08/20/24 215 lb 9.6 oz (97.8 kg)  11/28/23 208 lb (94.3 kg)  08/30/23 211 lb 8 oz (95.9 kg)    Physical Exam Vitals and nursing note reviewed.  Constitutional:      Appearance: She is well-developed.  HENT:     Head: Normocephalic and atraumatic.   Cardiovascular:     Rate and Rhythm: Normal rate and regular rhythm.     Heart sounds: Normal heart sounds. No murmur heard.    No friction rub. No gallop.  Pulmonary:     Effort: Pulmonary effort is normal. No tachypnea or respiratory distress.     Breath sounds: Normal breath sounds. No decreased breath sounds, wheezing, rhonchi or rales.  Chest:     Chest wall: No tenderness.  Musculoskeletal:        General: Normal range of motion.     Cervical back: Normal range of motion.  Skin:    General: Skin is warm and dry.  Neurological:  Mental Status: She is alert and oriented to person, place, and time.     Coordination: Coordination normal.  Psychiatric:        Behavior: Behavior normal. Behavior is cooperative.        Thought Content: Thought content normal.        Judgment: Judgment normal.          Patient has been counseled extensively about nutrition and exercise as well as the importance of adherence with medications and regular follow-up. The patient was given clear instructions to go to ER or return to medical center if symptoms don't improve, worsen or new problems develop. The patient verbalized understanding.   Follow-up: Return if symptoms worsen or fail to improve.   Haze LELON Servant, FNP-BC [facility] [place], [place] [phone]   09/02/2024, 11:54 AM      [1] No Known Allergies  "

## 2024-08-21 LAB — CMP14+EGFR
ALT: 39 IU/L — ABNORMAL HIGH (ref 0–32)
AST: 21 IU/L (ref 0–40)
Albumin: 4.4 g/dL (ref 3.9–4.9)
Alkaline Phosphatase: 63 IU/L (ref 41–116)
BUN/Creatinine Ratio: 19 (ref 9–23)
BUN: 15 mg/dL (ref 6–20)
Bilirubin Total: 0.6 mg/dL (ref 0.0–1.2)
CO2: 19 mmol/L — ABNORMAL LOW (ref 20–29)
Calcium: 9 mg/dL (ref 8.7–10.2)
Chloride: 103 mmol/L (ref 96–106)
Creatinine, Ser: 0.81 mg/dL (ref 0.57–1.00)
Globulin, Total: 3 g/dL (ref 1.5–4.5)
Glucose: 97 mg/dL (ref 70–99)
Potassium: 4.6 mmol/L (ref 3.5–5.2)
Sodium: 137 mmol/L (ref 134–144)
Total Protein: 7.4 g/dL (ref 6.0–8.5)
eGFR: 96 mL/min/1.73

## 2024-08-21 LAB — HEMOGLOBIN A1C
Est. average glucose Bld gHb Est-mCnc: 114 mg/dL
Hgb A1c MFr Bld: 5.6 % (ref 4.8–5.6)

## 2024-08-21 LAB — CBC WITH DIFFERENTIAL/PLATELET
Basophils Absolute: 0 x10E3/uL (ref 0.0–0.2)
Basos: 1 %
EOS (ABSOLUTE): 0.2 x10E3/uL (ref 0.0–0.4)
Eos: 2 %
Hematocrit: 40.5 % (ref 34.0–46.6)
Hemoglobin: 12.9 g/dL (ref 11.1–15.9)
Immature Grans (Abs): 0 x10E3/uL (ref 0.0–0.1)
Immature Granulocytes: 0 %
Lymphocytes Absolute: 2.6 x10E3/uL (ref 0.7–3.1)
Lymphs: 30 %
MCH: 27.8 pg (ref 26.6–33.0)
MCHC: 31.9 g/dL (ref 31.5–35.7)
MCV: 87 fL (ref 79–97)
Monocytes Absolute: 0.6 x10E3/uL (ref 0.1–0.9)
Monocytes: 7 %
Neutrophils Absolute: 5.3 x10E3/uL (ref 1.4–7.0)
Neutrophils: 60 %
Platelets: 201 x10E3/uL (ref 150–450)
RBC: 4.64 x10E6/uL (ref 3.77–5.28)
RDW: 12.6 % (ref 11.7–15.4)
WBC: 8.7 x10E3/uL (ref 3.4–10.8)

## 2024-08-21 LAB — THYROID PANEL WITH TSH
Free Thyroxine Index: 1.5 (ref 1.2–4.9)
T3 Uptake Ratio: 26 % (ref 24–39)
T4, Total: 5.8 ug/dL (ref 4.5–12.0)
TSH: 2.31 u[IU]/mL (ref 0.450–4.500)

## 2024-08-22 ENCOUNTER — Emergency Department (HOSPITAL_COMMUNITY): Admission: EM | Admit: 2024-08-22 | Discharge: 2024-08-22 | Discharge status: Against Medical Advice

## 2024-08-22 NOTE — ED Notes (Signed)
Pt called x'3, no response 

## 2024-08-22 NOTE — ED Notes (Signed)
 Pt called multiple time in lobby. No response.

## 2024-08-22 NOTE — ED Triage Notes (Signed)
 Pt c/o headache that started 2 weeks ago, reports she has taken OTC meds without relief.

## 2024-08-22 NOTE — ED Notes (Signed)
Pt called for triage, no response. 

## 2024-08-23 ENCOUNTER — Ambulatory Visit: Payer: Self-pay | Admitting: Nurse Practitioner

## 2024-09-03 ENCOUNTER — Encounter: Payer: Self-pay | Admitting: Nurse Practitioner
